# Patient Record
Sex: Male | Born: 1940 | Race: White | Hispanic: No
Health system: Southern US, Community
[De-identification: ages and names within clinical notes are randomized; demographics above are authoritative.]

---

## 2013-12-15 ENCOUNTER — Inpatient Hospital Stay: Payer: Self-pay | Admitting: Family Medicine

## 2013-12-15 LAB — CBC
HCT: 41.3 % (ref 40.0–52.0)
HGB: 13.4 g/dL (ref 13.0–18.0)
MCH: 31 pg (ref 26.0–34.0)
MCHC: 32.3 g/dL (ref 32.0–36.0)
MCV: 96 fL (ref 80–100)
Platelet: 301 10*3/uL (ref 150–440)
RBC: 4.31 10*6/uL — ABNORMAL LOW (ref 4.40–5.90)
RDW: 12.7 % (ref 11.5–14.5)
WBC: 12.5 10*3/uL — ABNORMAL HIGH (ref 3.8–10.6)

## 2013-12-15 LAB — COMPREHENSIVE METABOLIC PANEL
ALBUMIN: 2.2 g/dL — AB (ref 3.4–5.0)
Alkaline Phosphatase: 96 U/L
Anion Gap: 7 (ref 7–16)
BUN: 30 mg/dL — ABNORMAL HIGH (ref 7–18)
Bilirubin,Total: 0.5 mg/dL (ref 0.2–1.0)
CO2: 31 mmol/L (ref 21–32)
Calcium, Total: 8.6 mg/dL (ref 8.5–10.1)
Chloride: 97 mmol/L — ABNORMAL LOW (ref 98–107)
Creatinine: 1.19 mg/dL (ref 0.60–1.30)
EGFR (Non-African Amer.): >60
GLUCOSE: 111 mg/dL — AB (ref 65–99)
Osmolality: 277 (ref 275–301)
Potassium: 3.6 mmol/L (ref 3.5–5.1)
SGOT(AST): 30 U/L (ref 15–37)
SGPT (ALT): 24 U/L (ref 12–78)
Sodium: 135 mmol/L — ABNORMAL LOW (ref 136–145)
Total Protein: 6.8 g/dL (ref 6.4–8.2)

## 2013-12-15 LAB — URINALYSIS, COMPLETE
Bacteria: NONE SEEN
Bilirubin,UR: NEGATIVE
Blood: NEGATIVE
Glucose,UR: NEGATIVE mg/dL (ref 0–75)
KETONE: NEGATIVE
Leukocyte Esterase: NEGATIVE
Nitrite: NEGATIVE
Ph: 5 (ref 4.5–8.0)
Protein: NEGATIVE
RBC,UR: 1 /HPF (ref 0–5)
SQUAMOUS EPITHELIAL: NONE SEEN
Specific Gravity: 1.027 (ref 1.003–1.030)
WBC UR: 3 /HPF (ref 0–5)

## 2013-12-15 LAB — CK: CK, Total: 227 U/L

## 2013-12-15 LAB — TROPONIN I: Troponin-I: <0.02 ng/mL

## 2013-12-16 LAB — CBC WITH DIFFERENTIAL/PLATELET
Basophil #: 0.1 10*3/uL (ref 0.0–0.1)
Basophil %: 0.6 %
Eosinophil #: 0.4 10*3/uL (ref 0.0–0.7)
Eosinophil %: 3.8 %
HCT: 34.2 % — AB (ref 40.0–52.0)
HGB: 11.7 g/dL — AB (ref 13.0–18.0)
Lymphocyte #: 1.8 10*3/uL (ref 1.0–3.6)
Lymphocyte %: 19.1 %
MCH: 32.6 pg (ref 26.0–34.0)
MCHC: 34.2 g/dL (ref 32.0–36.0)
MCV: 95 fL (ref 80–100)
MONO ABS: 0.9 x10 3/mm (ref 0.2–1.0)
Monocyte %: 9.5 %
NEUTROS ABS: 6.4 10*3/uL (ref 1.4–6.5)
Neutrophil %: 67 %
Platelet: 228 10*3/uL (ref 150–440)
RBC: 3.59 10*6/uL — AB (ref 4.40–5.90)
RDW: 12.4 % (ref 11.5–14.5)
WBC: 9.5 10*3/uL (ref 3.8–10.6)

## 2013-12-16 LAB — COMPREHENSIVE METABOLIC PANEL
ALK PHOS: 81 U/L
ALT: 21 U/L (ref 12–78)
ANION GAP: 3 — AB (ref 7–16)
AST: 34 U/L (ref 15–37)
Albumin: 1.7 g/dL — ABNORMAL LOW (ref 3.4–5.0)
BILIRUBIN TOTAL: 0.3 mg/dL (ref 0.2–1.0)
BUN: 24 mg/dL — ABNORMAL HIGH (ref 7–18)
CALCIUM: 7.5 mg/dL — AB (ref 8.5–10.1)
Chloride: 100 mmol/L (ref 98–107)
Co2: 32 mmol/L (ref 21–32)
Creatinine: 1.06 mg/dL (ref 0.60–1.30)
EGFR (African American): >60
EGFR (Non-African Amer.): >60
Glucose: 97 mg/dL (ref 65–99)
OSMOLALITY: 274 (ref 275–301)
Potassium: 3.4 mmol/L — ABNORMAL LOW (ref 3.5–5.1)
Sodium: 135 mmol/L — ABNORMAL LOW (ref 136–145)
Total Protein: 5.7 g/dL — ABNORMAL LOW (ref 6.4–8.2)

## 2013-12-16 LAB — LIPID PANEL
CHOLESTEROL: 91 mg/dL (ref 0–200)
HDL: 30 mg/dL — AB (ref 40–60)
LDL CHOLESTEROL, CALC: 50 mg/dL (ref 0–100)
TRIGLYCERIDES: 57 mg/dL (ref 0–200)
VLDL Cholesterol, Calc: 11 mg/dL (ref 5–40)

## 2013-12-16 LAB — MAGNESIUM: MAGNESIUM: 1.6 mg/dL — AB

## 2013-12-16 LAB — TSH: Thyroid Stimulating Horm: 2.52 u[IU]/mL

## 2013-12-16 LAB — HEMOGLOBIN A1C: Hemoglobin A1C: 5.9 % (ref 4.2–6.3)

## 2013-12-17 LAB — BASIC METABOLIC PANEL
ANION GAP: 4 — AB (ref 7–16)
BUN: 17 mg/dL (ref 7–18)
CHLORIDE: 106 mmol/L (ref 98–107)
Calcium, Total: 7.5 mg/dL — ABNORMAL LOW (ref 8.5–10.1)
Co2: 27 mmol/L (ref 21–32)
Creatinine: 0.94 mg/dL (ref 0.60–1.30)
EGFR (African American): >60
EGFR (Non-African Amer.): >60
GLUCOSE: 95 mg/dL (ref 65–99)
OSMOLALITY: 275 (ref 275–301)
POTASSIUM: 4.3 mmol/L (ref 3.5–5.1)
SODIUM: 137 mmol/L (ref 136–145)

## 2013-12-17 LAB — URINE CULTURE

## 2013-12-19 LAB — CBC WITH DIFFERENTIAL/PLATELET
Basophil #: 0.1 10*3/uL (ref 0.0–0.1)
Basophil %: 0.5 %
EOS PCT: 0.2 %
Eosinophil #: 0 10*3/uL (ref 0.0–0.7)
HCT: 39 % — ABNORMAL LOW (ref 40.0–52.0)
HGB: 13.1 g/dL (ref 13.0–18.0)
LYMPHS ABS: 1.3 10*3/uL (ref 1.0–3.6)
Lymphocyte %: 7.7 %
MCH: 31.8 pg (ref 26.0–34.0)
MCHC: 33.7 g/dL (ref 32.0–36.0)
MCV: 95 fL (ref 80–100)
Monocyte #: 1.1 x10 3/mm — ABNORMAL HIGH (ref 0.2–1.0)
Monocyte %: 6.9 %
Neutrophil #: 13.8 10*3/uL — ABNORMAL HIGH (ref 1.4–6.5)
Neutrophil %: 84.7 %
PLATELETS: 342 10*3/uL (ref 150–440)
RBC: 4.13 10*6/uL — ABNORMAL LOW (ref 4.40–5.90)
RDW: 12.6 % (ref 11.5–14.5)
WBC: 16.3 10*3/uL — ABNORMAL HIGH (ref 3.8–10.6)

## 2013-12-19 LAB — BASIC METABOLIC PANEL
ANION GAP: 7 (ref 7–16)
BUN: 18 mg/dL (ref 7–18)
CALCIUM: 8.6 mg/dL (ref 8.5–10.1)
CO2: 27 mmol/L (ref 21–32)
Chloride: 101 mmol/L (ref 98–107)
Creatinine: 1.69 mg/dL — ABNORMAL HIGH (ref 0.60–1.30)
EGFR (Non-African Amer.): 40 — ABNORMAL LOW
GFR CALC AF AMER: 47 — AB
GLUCOSE: 131 mg/dL — AB (ref 65–99)
Osmolality: 274 (ref 275–301)
Potassium: 4 mmol/L (ref 3.5–5.1)
Sodium: 135 mmol/L — ABNORMAL LOW (ref 136–145)

## 2013-12-20 LAB — CBC WITH DIFFERENTIAL/PLATELET
BASOS ABS: 0.1 10*3/uL (ref 0.0–0.1)
BASOS PCT: 0.5 %
EOS ABS: 0 10*3/uL (ref 0.0–0.7)
Eosinophil %: 0.2 %
HCT: 37.1 % — AB (ref 40.0–52.0)
HGB: 12.4 g/dL — AB (ref 13.0–18.0)
LYMPHS PCT: 8.3 %
Lymphocyte #: 1.8 10*3/uL (ref 1.0–3.6)
MCH: 31.4 pg (ref 26.0–34.0)
MCHC: 33.4 g/dL (ref 32.0–36.0)
MCV: 94 fL (ref 80–100)
MONO ABS: 1.4 x10 3/mm — AB (ref 0.2–1.0)
Monocyte %: 6.6 %
NEUTROS PCT: 84.4 %
Neutrophil #: 18.1 10*3/uL — ABNORMAL HIGH (ref 1.4–6.5)
Platelet: 390 10*3/uL (ref 150–440)
RBC: 3.95 10*6/uL — ABNORMAL LOW (ref 4.40–5.90)
RDW: 12.6 % (ref 11.5–14.5)
WBC: 21.4 10*3/uL — ABNORMAL HIGH (ref 3.8–10.6)

## 2013-12-20 LAB — BASIC METABOLIC PANEL
ANION GAP: 7 (ref 7–16)
BUN: 27 mg/dL — AB (ref 7–18)
CALCIUM: 8.2 mg/dL — AB (ref 8.5–10.1)
CHLORIDE: 99 mmol/L (ref 98–107)
CO2: 25 mmol/L (ref 21–32)
CREATININE: 3.13 mg/dL — AB (ref 0.60–1.30)
EGFR (African American): 22 — ABNORMAL LOW
EGFR (Non-African Amer.): 19 — ABNORMAL LOW
Glucose: 106 mg/dL — ABNORMAL HIGH (ref 65–99)
OSMOLALITY: 268 (ref 275–301)
Potassium: 4.8 mmol/L (ref 3.5–5.1)
Sodium: 131 mmol/L — ABNORMAL LOW (ref 136–145)

## 2013-12-20 LAB — CULTURE, BLOOD (SINGLE)

## 2013-12-20 LAB — CREATININE, SERUM
Creatinine: 3.8 mg/dL — ABNORMAL HIGH (ref 0.60–1.30)
EGFR (African American): 18 — ABNORMAL LOW
EGFR (Non-African Amer.): 15 — ABNORMAL LOW

## 2013-12-21 LAB — URINALYSIS, COMPLETE
Bacteria: NONE SEEN
Bilirubin,UR: NEGATIVE
Blood: NEGATIVE
Glucose,UR: NEGATIVE mg/dL (ref 0–75)
Ketone: NEGATIVE
Leukocyte Esterase: NEGATIVE
Nitrite: NEGATIVE
Ph: 5 (ref 4.5–8.0)
Protein: NEGATIVE
RBC,UR: 5 /HPF (ref 0–5)
SPECIFIC GRAVITY: 1.01 (ref 1.003–1.030)
Squamous Epithelial: NONE SEEN
WBC UR: 1 /HPF (ref 0–5)

## 2013-12-21 LAB — BASIC METABOLIC PANEL
Anion Gap: 7 (ref 7–16)
BUN: 36 mg/dL — AB (ref 7–18)
CALCIUM: 8.1 mg/dL — AB (ref 8.5–10.1)
CREATININE: 4.31 mg/dL — AB (ref 0.60–1.30)
Chloride: 99 mmol/L (ref 98–107)
Co2: 23 mmol/L (ref 21–32)
GFR CALC AF AMER: 15 — AB
GFR CALC NON AF AMER: 13 — AB
GLUCOSE: 108 mg/dL — AB (ref 65–99)
Osmolality: 268 (ref 275–301)
Potassium: 4.9 mmol/L (ref 3.5–5.1)
SODIUM: 129 mmol/L — AB (ref 136–145)

## 2013-12-21 LAB — CBC WITH DIFFERENTIAL/PLATELET
BASOS PCT: 0.3 %
Basophil #: 0.1 10*3/uL (ref 0.0–0.1)
EOS PCT: 0.5 %
Eosinophil #: 0.1 10*3/uL (ref 0.0–0.7)
HCT: 35.4 % — AB (ref 40.0–52.0)
HGB: 11.9 g/dL — AB (ref 13.0–18.0)
LYMPHS PCT: 7 %
Lymphocyte #: 1.5 10*3/uL (ref 1.0–3.6)
MCH: 31.5 pg (ref 26.0–34.0)
MCHC: 33.7 g/dL (ref 32.0–36.0)
MCV: 94 fL (ref 80–100)
MONO ABS: 1.8 x10 3/mm — AB (ref 0.2–1.0)
Monocyte %: 8.6 %
Neutrophil #: 17.5 10*3/uL — ABNORMAL HIGH (ref 1.4–6.5)
Neutrophil %: 83.6 %
Platelet: 400 10*3/uL (ref 150–440)
RBC: 3.78 10*6/uL — ABNORMAL LOW (ref 4.40–5.90)
RDW: 12.5 % (ref 11.5–14.5)
WBC: 20.9 10*3/uL — AB (ref 3.8–10.6)

## 2013-12-22 LAB — CBC WITH DIFFERENTIAL/PLATELET
BASOS ABS: 0.1 10*3/uL (ref 0.0–0.1)
Basophil %: 0.7 %
EOS PCT: 1.7 %
Eosinophil #: 0.2 10*3/uL (ref 0.0–0.7)
HCT: 31.2 % — ABNORMAL LOW (ref 40.0–52.0)
HGB: 10.8 g/dL — ABNORMAL LOW (ref 13.0–18.0)
LYMPHS ABS: 1.5 10*3/uL (ref 1.0–3.6)
LYMPHS PCT: 11.5 %
MCH: 32.5 pg (ref 26.0–34.0)
MCHC: 34.4 g/dL (ref 32.0–36.0)
MCV: 95 fL (ref 80–100)
MONO ABS: 1.2 x10 3/mm — AB (ref 0.2–1.0)
MONOS PCT: 9.4 %
NEUTROS ABS: 9.9 10*3/uL — AB (ref 1.4–6.5)
Neutrophil %: 76.7 %
Platelet: 343 10*3/uL (ref 150–440)
RBC: 3.31 10*6/uL — ABNORMAL LOW (ref 4.40–5.90)
RDW: 12.9 % (ref 11.5–14.5)
WBC: 12.9 10*3/uL — ABNORMAL HIGH (ref 3.8–10.6)

## 2013-12-22 LAB — COMPREHENSIVE METABOLIC PANEL
ALBUMIN: 1.3 g/dL — AB (ref 3.4–5.0)
AST: 30 U/L (ref 15–37)
Alkaline Phosphatase: 74 U/L
Anion Gap: 5 — ABNORMAL LOW (ref 7–16)
BUN: 22 mg/dL — ABNORMAL HIGH (ref 7–18)
Bilirubin,Total: 0.3 mg/dL (ref 0.2–1.0)
Calcium, Total: 8 mg/dL — ABNORMAL LOW (ref 8.5–10.1)
Chloride: 104 mmol/L (ref 98–107)
Co2: 26 mmol/L (ref 21–32)
Creatinine: 1.35 mg/dL — ABNORMAL HIGH (ref 0.60–1.30)
EGFR (African American): >60
EGFR (Non-African Amer.): 53 — ABNORMAL LOW
GLUCOSE: 83 mg/dL (ref 65–99)
OSMOLALITY: 273 (ref 275–301)
Potassium: 3.9 mmol/L (ref 3.5–5.1)
Sodium: 135 mmol/L — ABNORMAL LOW (ref 136–145)
Total Protein: 5.1 g/dL — ABNORMAL LOW (ref 6.4–8.2)

## 2013-12-22 LAB — PROTEIN ELECTROPHORESIS(ARMC)

## 2013-12-23 LAB — CBC WITH DIFFERENTIAL/PLATELET
Basophil #: 0.1 10*3/uL (ref 0.0–0.1)
Basophil %: 1 %
Eosinophil #: 0.2 10*3/uL (ref 0.0–0.7)
Eosinophil %: 2 %
HCT: 30.7 % — ABNORMAL LOW (ref 40.0–52.0)
HGB: 10.1 g/dL — ABNORMAL LOW (ref 13.0–18.0)
LYMPHS ABS: 2 10*3/uL (ref 1.0–3.6)
Lymphocyte %: 20.4 %
MCH: 31.5 pg (ref 26.0–34.0)
MCHC: 33 g/dL (ref 32.0–36.0)
MCV: 95 fL (ref 80–100)
Monocyte #: 0.9 x10 3/mm (ref 0.2–1.0)
Monocyte %: 9.2 %
Neutrophil #: 6.7 10*3/uL — ABNORMAL HIGH (ref 1.4–6.5)
Neutrophil %: 67.4 %
Platelet: 376 10*3/uL (ref 150–440)
RBC: 3.22 10*6/uL — AB (ref 4.40–5.90)
RDW: 12.6 % (ref 11.5–14.5)
WBC: 9.9 10*3/uL (ref 3.8–10.6)

## 2013-12-23 LAB — BASIC METABOLIC PANEL
ANION GAP: 5 — AB (ref 7–16)
BUN: 17 mg/dL (ref 7–18)
Calcium, Total: 7.9 mg/dL — ABNORMAL LOW (ref 8.5–10.1)
Chloride: 102 mmol/L (ref 98–107)
Co2: 28 mmol/L (ref 21–32)
Creatinine: 0.97 mg/dL (ref 0.60–1.30)
EGFR (Non-African Amer.): >60
GLUCOSE: 86 mg/dL (ref 65–99)
Osmolality: 271 (ref 275–301)
POTASSIUM: 3.6 mmol/L (ref 3.5–5.1)
Sodium: 135 mmol/L — ABNORMAL LOW (ref 136–145)

## 2013-12-23 LAB — UR PROT ELECTROPHORESIS, URINE RANDOM

## 2014-01-02 ENCOUNTER — Encounter: Payer: Self-pay | Admitting: Surgery

## 2014-01-07 ENCOUNTER — Other Ambulatory Visit: Payer: Self-pay | Admitting: Surgery

## 2014-01-07 LAB — URINALYSIS, COMPLETE
Bilirubin,UR: NEGATIVE
Glucose,UR: NEGATIVE mg/dL (ref 0–75)
Hyaline Cast: 47
Ketone: NEGATIVE
Nitrite: POSITIVE
Ph: 6 (ref 4.5–8.0)
Protein: 100
RBC,UR: 277 /HPF (ref 0–5)
SPECIFIC GRAVITY: 1.014 (ref 1.003–1.030)
Squamous Epithelial: NONE SEEN

## 2014-01-14 ENCOUNTER — Encounter: Payer: Self-pay | Admitting: Surgery

## 2014-01-16 ENCOUNTER — Encounter: Payer: Self-pay | Admitting: Surgery

## 2014-01-26 LAB — BASIC METABOLIC PANEL
ANION GAP: 8 (ref 7–16)
BUN: 12 mg/dL (ref 7–18)
CO2: 25 mmol/L (ref 21–32)
Calcium, Total: 8.4 mg/dL — ABNORMAL LOW (ref 8.5–10.1)
Chloride: 103 mmol/L (ref 98–107)
Creatinine: 0.98 mg/dL (ref 0.60–1.30)
EGFR (African American): >60
EGFR (Non-African Amer.): >60
GLUCOSE: 209 mg/dL — AB (ref 65–99)
OSMOLALITY: 278 (ref 275–301)
Potassium: 3.1 mmol/L — ABNORMAL LOW (ref 3.5–5.1)
SODIUM: 136 mmol/L (ref 136–145)

## 2014-01-26 LAB — URINALYSIS, COMPLETE
BILIRUBIN, UR: NEGATIVE
Glucose,UR: NEGATIVE mg/dL (ref 0–75)
Ketone: NEGATIVE
Nitrite: POSITIVE
Ph: 5 (ref 4.5–8.0)
Protein: 30
Specific Gravity: 1.025 (ref 1.003–1.030)
WBC UR: 129 /HPF (ref 0–5)

## 2014-01-26 LAB — CBC WITH DIFFERENTIAL/PLATELET
BASOS ABS: 0.1 10*3/uL (ref 0.0–0.1)
Basophil %: 0.7 %
Eosinophil #: 0.1 10*3/uL (ref 0.0–0.7)
Eosinophil %: 0.4 %
HCT: 37.8 % — ABNORMAL LOW (ref 40.0–52.0)
HGB: 12.4 g/dL — ABNORMAL LOW (ref 13.0–18.0)
LYMPHS ABS: 2.6 10*3/uL (ref 1.0–3.6)
LYMPHS PCT: 18 %
MCH: 29.4 pg (ref 26.0–34.0)
MCHC: 32.9 g/dL (ref 32.0–36.0)
MCV: 89 fL (ref 80–100)
Monocyte #: 1.5 x10 3/mm — ABNORMAL HIGH (ref 0.2–1.0)
Monocyte %: 10.5 %
Neutrophil #: 10.1 10*3/uL — ABNORMAL HIGH (ref 1.4–6.5)
Neutrophil %: 70.4 %
Platelet: 401 10*3/uL (ref 150–440)
RBC: 4.23 10*6/uL — AB (ref 4.40–5.90)
RDW: 14.1 % (ref 11.5–14.5)
WBC: 14.3 10*3/uL — ABNORMAL HIGH (ref 3.8–10.6)

## 2014-01-26 LAB — TROPONIN I

## 2014-01-27 ENCOUNTER — Inpatient Hospital Stay: Payer: Self-pay | Admitting: Internal Medicine

## 2014-01-27 LAB — SYNOVIAL CELL COUNT + DIFF, W/ CRYSTALS
BASOS ABS: 0 %
Crystals, Joint Fluid: NONE SEEN
Eosinophil: 0 %
LYMPHS PCT: 0 %
Neutrophils: 92 %
Nucleated Cell Count: 37272 /mm3
OTHER MONONUCLEAR CELLS: 8 %
Other Cells BF: 0 %

## 2014-01-27 LAB — HEMOGLOBIN A1C: HEMOGLOBIN A1C: 5.9 % (ref 4.2–6.3)

## 2014-01-28 LAB — BASIC METABOLIC PANEL
Anion Gap: 3 — ABNORMAL LOW (ref 7–16)
BUN: 11 mg/dL (ref 7–18)
CALCIUM: 8 mg/dL — AB (ref 8.5–10.1)
CHLORIDE: 103 mmol/L (ref 98–107)
CREATININE: 0.86 mg/dL (ref 0.60–1.30)
Co2: 29 mmol/L (ref 21–32)
Glucose: 154 mg/dL — ABNORMAL HIGH (ref 65–99)
Osmolality: 273 (ref 275–301)
Potassium: 3.2 mmol/L — ABNORMAL LOW (ref 3.5–5.1)
Sodium: 135 mmol/L — ABNORMAL LOW (ref 136–145)

## 2014-01-28 LAB — CBC WITH DIFFERENTIAL/PLATELET
BASOS PCT: 0.5 %
Basophil #: 0.1 10*3/uL (ref 0.0–0.1)
Eosinophil #: 0.3 10*3/uL (ref 0.0–0.7)
Eosinophil %: 1.8 %
HCT: 33.5 % — AB (ref 40.0–52.0)
HGB: 10.9 g/dL — ABNORMAL LOW (ref 13.0–18.0)
LYMPHS ABS: 1.9 10*3/uL (ref 1.0–3.6)
LYMPHS PCT: 13 %
MCH: 29 pg (ref 26.0–34.0)
MCHC: 32.6 g/dL (ref 32.0–36.0)
MCV: 89 fL (ref 80–100)
MONO ABS: 1.3 x10 3/mm — AB (ref 0.2–1.0)
MONOS PCT: 8.6 %
NEUTROS ABS: 11.2 10*3/uL — AB (ref 1.4–6.5)
Neutrophil %: 76.1 %
PLATELETS: 298 10*3/uL (ref 150–440)
RBC: 3.76 10*6/uL — AB (ref 4.40–5.90)
RDW: 14 % (ref 11.5–14.5)
WBC: 14.7 10*3/uL — ABNORMAL HIGH (ref 3.8–10.6)

## 2014-01-28 LAB — CREATININE, SERUM
CREATININE: 0.89 mg/dL (ref 0.60–1.30)
EGFR (African American): >60
EGFR (Non-African Amer.): >60

## 2014-01-28 LAB — VANCOMYCIN, TROUGH: Vancomycin, Trough: 17 ug/mL (ref 10–20)

## 2014-01-29 LAB — BASIC METABOLIC PANEL
Anion Gap: 5 — ABNORMAL LOW (ref 7–16)
BUN: 17 mg/dL (ref 7–18)
Calcium, Total: 8.3 mg/dL — ABNORMAL LOW (ref 8.5–10.1)
Chloride: 105 mmol/L (ref 98–107)
Co2: 25 mmol/L (ref 21–32)
Creatinine: 0.83 mg/dL (ref 0.60–1.30)
EGFR (Non-African Amer.): >60
Glucose: 159 mg/dL — ABNORMAL HIGH (ref 65–99)
OSMOLALITY: 275 (ref 275–301)
POTASSIUM: 4 mmol/L (ref 3.5–5.1)
Sodium: 135 mmol/L — ABNORMAL LOW (ref 136–145)

## 2014-01-29 LAB — CBC WITH DIFFERENTIAL/PLATELET
BASOS ABS: 0.1 10*3/uL (ref 0.0–0.1)
Basophil %: 0.3 %
EOS PCT: 0 %
Eosinophil #: 0 10*3/uL (ref 0.0–0.7)
HCT: 33.1 % — AB (ref 40.0–52.0)
HGB: 11 g/dL — ABNORMAL LOW (ref 13.0–18.0)
LYMPHS ABS: 1.5 10*3/uL (ref 1.0–3.6)
Lymphocyte %: 8.5 %
MCH: 29.3 pg (ref 26.0–34.0)
MCHC: 33.1 g/dL (ref 32.0–36.0)
MCV: 88 fL (ref 80–100)
MONOS PCT: 4.3 %
Monocyte #: 0.7 x10 3/mm (ref 0.2–1.0)
NEUTROS ABS: 15.1 10*3/uL — AB (ref 1.4–6.5)
Neutrophil %: 86.9 %
PLATELETS: 329 10*3/uL (ref 150–440)
RBC: 3.74 10*6/uL — AB (ref 4.40–5.90)
RDW: 14.1 % (ref 11.5–14.5)
WBC: 17.4 10*3/uL — ABNORMAL HIGH (ref 3.8–10.6)

## 2014-01-30 LAB — CBC WITH DIFFERENTIAL/PLATELET
Basophil #: 0 10*3/uL (ref 0.0–0.1)
Basophil %: 0.1 %
EOS ABS: 0 10*3/uL (ref 0.0–0.7)
EOS PCT: 0 %
HCT: 32.1 % — ABNORMAL LOW (ref 40.0–52.0)
HGB: 10.7 g/dL — AB (ref 13.0–18.0)
LYMPHS ABS: 1.4 10*3/uL (ref 1.0–3.6)
LYMPHS PCT: 6.2 %
MCH: 29.4 pg (ref 26.0–34.0)
MCHC: 33.2 g/dL (ref 32.0–36.0)
MCV: 89 fL (ref 80–100)
MONO ABS: 0.8 x10 3/mm (ref 0.2–1.0)
Monocyte %: 3.8 %
NEUTROS PCT: 89.9 %
Neutrophil #: 19.9 10*3/uL — ABNORMAL HIGH (ref 1.4–6.5)
Platelet: 314 10*3/uL (ref 150–440)
RBC: 3.63 10*6/uL — ABNORMAL LOW (ref 4.40–5.90)
RDW: 13.6 % (ref 11.5–14.5)
WBC: 22.1 10*3/uL — AB (ref 3.8–10.6)

## 2014-01-31 LAB — CBC WITH DIFFERENTIAL/PLATELET
BASOS PCT: 0.2 %
Basophil #: 0 10*3/uL (ref 0.0–0.1)
EOS ABS: 0 10*3/uL (ref 0.0–0.7)
EOS PCT: 0.1 %
HCT: 35.9 % — ABNORMAL LOW (ref 40.0–52.0)
HGB: 11.7 g/dL — ABNORMAL LOW (ref 13.0–18.0)
Lymphocyte #: 2.5 10*3/uL (ref 1.0–3.6)
Lymphocyte %: 15.8 %
MCH: 28.8 pg (ref 26.0–34.0)
MCHC: 32.6 g/dL (ref 32.0–36.0)
MCV: 88 fL (ref 80–100)
MONO ABS: 1.2 x10 3/mm — AB (ref 0.2–1.0)
Monocyte %: 7.6 %
NEUTROS PCT: 76.3 %
Neutrophil #: 12 10*3/uL — ABNORMAL HIGH (ref 1.4–6.5)
Platelet: 378 10*3/uL (ref 150–440)
RBC: 4.07 10*6/uL — ABNORMAL LOW (ref 4.40–5.90)
RDW: 13.6 % (ref 11.5–14.5)
WBC: 15.7 10*3/uL — AB (ref 3.8–10.6)

## 2014-01-31 LAB — URINE CULTURE

## 2014-02-01 LAB — BODY FLUID CULTURE

## 2014-02-13 ENCOUNTER — Encounter: Payer: Self-pay | Admitting: Surgery

## 2015-02-06 NOTE — Op Note (Signed)
PATIENT NAME:  Jonathan Hogan, Desmund S MR#:  [id number] DATE OF BIRTH:  1941-04-04  DATE OF PROCEDURE:  12/20/2013  PREOPERATIVE DIAGNOSIS:  Left hip decubitus ulcer.   POSTOPERATIVE DIAGNOSIS:  Left hip decubitus ulcer.  OPERATION:  Wound vac change.   SURGEON:  Quentin Orealph L. Ely, M.D.   ANESTHESIA:  Monitored anesthetic care.   With the patient in the supine position, the patient was sedated and then moved into the left side up position, secured by the bean bag device.  The previous wound VAC was removed without difficulty.  There was significant improvement in the wound from the previous evaluation.  The area was cleaned aggressively.  Meptel was placed on both sides of the wound because of the skin breakdown.  Ioban placed over the base of the wound.  The wound was then uncovered.  Black foam inserted into the base of the wound covered with a second layer of dressing.  The wound VAC was then placed in a standard fashion.  Suction was achieved without difficulty.  The patient appeared to tolerate the procedure well.  He was placed back on the stretcher and returned to the recovery room.    ____________________________ Quentin Orealph L. Ely III, MD rle:ea D: 12/20/2013 08:53:45 ET T: 12/20/2013 17:34:27 ET JOB#: [id number]  cc: Quentin Orealph L. Ely III, MD, <Dictator> Quentin OreALPH L ELY MD ELECTRONICALLY SIGNED 12/21/2013 18:28

## 2015-02-06 NOTE — H&P (Signed)
PATIENT NAME:  Jonathan Hogan, Jonathan Hogan MR#:  [id number] DATE OF BIRTH:  January 06, 1941  DATE OF ADMISSION:  01/27/2014  REFERRING PHYSICIAN: Dr. Governor Rooksebecca Lord  PRIMARY CARE PHYSICIAN: Nonlocal.   CHIEF COMPLAINT: Elbow pain and cough.  HISTORY OF PRESENT ILLNESS: A [age] year old Caucasian gentleman with a past medical history of sacral decubitus ulcer status post wound VAC treatments as well as significant tobacco abuse, presenting with right elbow pain and cough. Describes 2-day duration of right elbow pain described only as "pain". Intensity 10/10, worse with movement. No relieving factors, associated redness and swelling of the elbow. Denies any local trauma. He also complains of a cough for 2 to 3-day duration productive of whitish to clear sputum with associated shortness of breath, described as dyspnea on exertion. Denies any chest pain or recent sick contacts. Also denotes having dysuria, which is intermittent.   REVIEW OF SYSTEMS: CONSTITUTIONAL: In the emergency department noted to be hypoxemic, tachypneic, tachycardic in the emergency room. Denies fevers, chills, weakness.  EYES: Denies blurry, double vision, or eye pain.  HEENT: Denies tinnitus, ear pain, hearing loss.  RESPIRATORY: Positive for cough and shortness of breath as described above.  CARDIOVASCULAR: Denies chest pain, palpitations, edema.  GASTROINTESTINAL: Denies nausea, vomiting, diarrhea, abdominal pain.  GENITOURINARY: Positive for dysuria. Denies hematuria.  ENDOCRINE: Denies nocturia or thyroid problems.  HEMATOLOGIC AND LYMPHATIC: Denies easy bruising or bleeding. SKIN: Positive for erythema over the right elbow. Denies any rashes or lesions.  MUSCULOSKELETAL: Positive for pain in the right elbow as described above. Denies pain in neck, back, shoulders, knees, or hips. No arthritic symptoms.  NEUROLOGIC: Denies paralysis or paresthesias.  PSYCHIATRIC: Denies anxiety or depressive symptoms.   Otherwise, full review of systems  performed by me is negative.    PAST MEDICAL HISTORY: Significant tobacco abuse, osteoarthritis, as well as sacral decubitus ulcer status post wound VAC treatment.   SOCIAL HISTORY: Positive for tobacco use 1-1/2 packs daily, as well as alcohol usage. States that he drinks "Whenever I can get it." Denies drug use.   FAMILY HISTORY: Denies any cardiovascular or pulmonary disorders.   ALLERGIES: No known allergies.   HOME MEDICATIONS: Acetaminophen 325 mg 2 tabs every 4 hours as needed for pain or fever. Otherwise, no further medications.   PHYSICAL EXAMINATION:  VITAL SIGNS: Temperature 98.3, heart rate 112, respirations 24, blood pressure 111/70, saturating 93% on supplemental O2. Weight 83.9 kg, BMI 26.5.  GENERAL: A somewhat disheveled Caucasian gentleman, currently in no acute distress.  HEAD: Normocephalic, atraumatic.  EYES: Pupils equal, round, reactive to light. Extraocular muscles intact. No scleral icterus.  MOUTH: Moist mucous membranes.  Dentition intact. No abscess noted.  EARS, NOSE, AND THROAT: Clear without exudates. No external lesions.  NECK: Supple. No thyromegaly. No nodules. No JVD.  PULMONARY: Coarse breath sounds, diminished breath sounds throughout all lung fields with scan wheeze noted. No use of accessory muscles but tachypneic. Good respiratory effort.  CHEST:  Nontender on palpation.  CARDIOVASCULAR: S1, S2, tachycardic. No murmurs, rubs, or gallops. No edema. Pedal pulses 2+ bilaterally.  GASTROINTESTINAL: Soft, nontender, nondistended. No masses. Positive bowel sounds. No hepatosplenomegaly.  MUSCULOSKELETAL: Over the right elbow is currently bent, after joint aspiration, there is some surrounding erythema and edema. The joint is also tender to palpitation. Otherwise no further joint swelling, clubbing, or edema. Range of motion is full in all extremities. NEUROLOGIC: Cranial nerves II through XII intact. No gross neurologic deficits. Sensation intact. Reflexes  intact.  SKIN: No ulcerations, lesions, no  rashes, or cyanosis. Skin warm and dry and turgor intact. PSYCHIATRIC: Mood and affect within normal limits. The patient alert and oriented x 3. Insight and judgment intact.   LABORATORY DATA: Chest x-ray performed revealing no acute cardiopulmonary process. Sodium 136, potassium 3.1, chloride 103, bicarbonate 25, BUN 12, creatinine 0.98, glucose 209. Troponin less than 0.02. WBC 14.3, hemoglobin 12.4, platelets of 401,000. Synovial fluid: Most of the results are pending at this time. However, nucleated cells 37,272, which is 92% neutrophils. No crystals .   URINALYSIS: WBCs 129, RBCs 15, leukocyte esterase 3+, nitrite positive. Epithelial cells 1.   ASSESSMENT AND PLAN: A 74 year old gentleman presenting with right elbow pain cough.  1. Sepsis: Meeting septic criteria by respiratory rate, heart rate, and leukocytosis. Multiple possible etiology of source including urinary tract infection and septic joint. Panculture including urine, blood, and joint aspirate for Gram stain. Antibiotic coverage will dose ceftriaxone 2 grams as well as the addition of vancomycin, received azithromycin in the emergency department so far. Will provide IV fluid hydration to keep mean arterial pressure greater than 65. Follow culture data. Whenever it returns, we can downgrade antibiotic therapy.  2. Hyperglycemia with random blood glucose greater than 200. Likely, diabetic we will check hemoglobin A1c. Add insulin sliding scale with q. 6 hour Accu-Cheks.  3. Hypokalemia. Replace potassium to goal of 4-5.  4. Acute exacerbation given by cough and hypoxemia and wheezing on exam. DuoNeb therapy as well as supplemental oxygen as required to keep oxygen saturation greater than 92%. 5. Venous thromboembolism prophylaxis with sequential compression devices.   CODE STATUS: The patient is full code.   TIME SPENT: 45 minutes.   ____________________________ Cletis Athens. Aeden Matranga,  MD dkh:lt D: 01/27/2014 00:43:12 ET T: 01/27/2014 01:17:37 ET JOB#: [id number]  cc: Cletis Athens. Aslyn Cottman, MD, <Dictator> Treyten Monestime Synetta Shadow MD ELECTRONICALLY SIGNED 01/27/2014 2:50

## 2015-02-06 NOTE — Discharge Summary (Signed)
PATIENT NAME:  Jonathan Hogan, Jonathan Hogan MR#:  [id number] DATE OF BIRTH:  02-Oct-1941  DATE OF ADMISSION:  01/27/2014  DATE OF DISCHARGE:  01/31/2014  DISCHARGE DIAGNOSES:   1.  Sepsis secondary to right elbow cellulitis and urinary tract infection that was treated. 2.  Chronic obstructive pulmonary disease exacerbation.  3.  Hyperglycemia due to steroids.  4.  Tobacco abuse.  5.  Hypokalemia.   DISCHARGE MEDICATIONS:   1.  Erythromycin 500 mg p.o. daily for 3 days.  2.  Keflex 500 mg p.o. 4 times daily for 10 days.  3.  Prednisone 20 mg 3 tablets daily for 3 days, 2 tablets daily for 3 days, 1 tablet daily for 3 days.  4.  Spiriva 18 mcg inhalation daily.  5.  Albuterol 2 puffs 4 times daily.  Note that Albuterol, azithromycin, Keflex, Advair, prednisone, and Spiriva all are new medications.   CONSULTATIONS: Orthopedic consult with Dr. Martha Clan.  IMAGING: The patient's troponin is less than 0.02 on admission. Hemoglobin A1c is 5.9. Electrolytes on admission: Sodium 136, potassium 3.1, chloride 103, bicarb 25, BUN 12, creatinine 0.96, glucose 209. White count on admission 14.3, hemoglobin 12.4, hematocrit 37.8, platelets 401. Head CT on admission shows no acute intracranial abnormalities. Findings suggestive of acute sinusitis and mild cerebral atrophy. EKG shows sinus tachycardia with some PVCs. Chest x-ray showed no active pulmonary disease. UA showed WBC 129 and leukocyte esterase 3+. Blood cultures were negative.   The patient had arthrocentesis from the right elbow, and cultures from that showed negative. X-ray of the right elbow showed small joint effusion. MRA of the right hand showed diffuse subcutaneous soft tissue swelling and edema without  focal abscess. The patient's right wrist and elbow joint showed effusion, but no specific findings to suggest septic arthritis or osteomyelitis. White count increased to 22.1 on April 17. The patient's urine culture showed 1000 colonies for gram-negative  rods. White count did come down to 15.7 on April 18.   VITAL SIGNS: At time of discharge showed temperature 97.4, heart rate 76, blood pressure 150/92, sats 94% on room air.   HOSPITAL COURSE: This patient is a 74 year old male patient with a history of heavy tobacco abuse, who came in because of elbow pain and cough. The patient has right elbow pain associated with swelling for 2 days. The patient did not have any insect bite. Look at the History and Physical for full details.   1. The patient was admitted for sepsis because of his vitals with heart tachycardia and leukocytosis. The patient's sepsis was thought to be due to UTI and possible septic joint. The patient had arthrocentesis in the ER, and cultures showed negative. He was started on Rocephin and vancomycin and azithromycin. The patient was seen by Dr. Martha Clan as well regarding his elbow cellulitis, and he had an MRI of the right elbow and x-ray, which did not show any septic joint. The patient's range of motion was good at the fingers, and we continued the vancomycin and ceftriaxone. The right thumb was elevated with ice packs to decrease the swelling. The patient's cellulitis nicely improved without any surgical intervention, and patient was thought to have only skin and soft tissue infection rather than a joint infection. At the time of discharge, he was able to move the right hand and the swelling is nearly gone.  The patient's range of motion is intact. We discharged him with Keflex, and he can follow with Dr. Martha Clan in 2 to 3 weeks, if the  symptoms worsen or recur.   2.  Chronic obstructive pulmonary disease exacerbation. The patient smokes 2 packs per day. He moved from [place]. The patient has no primary doctor here. He did have lots of cough and trouble breathing, but was not hypoxic. We continued him on antibiotics and steroids, discharged him home with steroids and antibiotics. I have added the Advair and Spiriva Combivent. The  patient is advised to quit smoking, and he is not interested in that.   3.  Urinary tract infection. The patient's urine culture showed less than 100,000 colonies of gram-negative rods, and hypokalemia treated.   4.  Hyperglycemia. The patient's hemoglobin A1c is only 5.9.   5. Elevated blood pressure, without diagnosis of hypertension. The patient was a little anxious yesterday, wanted to go home. His blood pressure was slightly elevated, but during the hospital stay his blood pressure was around 120/80. I did not start him on any medication for the blood pressure.   The patient is discharged home with Home Health.   Time spent is more than 30 minutes.    ____________________________ Katha HammingSnehalatha Athea Haley, MD sk:mr D: 02/01/2014 07:56:37 ET T: 02/01/2014 16:33:23 ET JOB#: [id number]  cc: Katha HammingSnehalatha Dilan Fullenwider, MD, <Dictator> Katha HammingSNEHALATHA Terilynn Buresh MD ELECTRONICALLY SIGNED 02/15/2014 22:38

## 2015-02-06 NOTE — Op Note (Signed)
PATIENT NAME:  Jonathan Hogan, Jonathan Hogan MR#:  [id number] DATE OF BIRTH:  09-Apr-1941  DATE OF PROCEDURE:  12/17/2013   PREOPERATIVE DIAGNOSIS: Left hip decubitus.  POSTOPERATIVE DIAGNOSIS:  Left hip decubitus.  OPERATION:  Debridement and wound VAC placement.  SURGEON:  Dr. Michela PitcherEly.  ASSISTANVeleta Miners:  Ollis, PA student.  ANESTHESIA:  General.  OPERATIVE PROCEDURE:  With the patient in supine position after induction of appropriate general anesthesia, he was then placed in the right side down position with the beanbag patient positioner. Arm was placed over his head. Left hip was prepped with Betadine and draped with sterile towels. The eschar was ellipsed and removed. Combination of sharp dissection with a knife and Bovie dissection removed the large eschar, including skin and subcutaneous tissue. There was no exposed bone and no exposed muscle.  The area was cauterized, irrigated and a wound VAC placed. The skin was protected with  Mepitel dressing. The patient returned to the recovery room in satisfactory condition.    ____________________________ Quentin Orealph L. Ely III, MD rle:ce D: 12/17/2013 11:34:03 ET T: 12/17/2013 18:38:09 ET JOB#: [id number]  cc: Quentin Orealph L. Ely III, MD, <Dictator> Quentin OreALPH L ELY MD ELECTRONICALLY SIGNED 12/19/2013 12:30

## 2015-02-06 NOTE — Discharge Summary (Signed)
PATIENT NAME:  Jonathan Hogan, [name]:  [id number] DATE OF BIRTH:  [birth year]  DATE OF ADMISSION:  12/15/2013 DATE OF DISCHARGE:  12/26/2013  PRESENTING COMPLAINT: Cellulitis and decubitus ulcer.   CONSULTANTS: Surgery, Dr. Michela PitcherEly.  PROCEDURES: Left hip decubitus debridement and wound VAC placement.   CONDITION ON DISCHARGE: Fair.  HOME CARE: Home health PT and RN has been set up.   FINAL DISCHARGE DIAGNOSES:  1.  Left decubitus ulcer of the buttock status post debridement with wound VAC placement.  2.  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  CODE STATUS: FULL.  DISCHARGE MEDICATIONS: 1.  Tylenol 325 mg 2 tablets every 4 hours as needed.  2.  Docusate 100 mg 1 capsule b.i.d. as needed.  3.  Tamsulosin 0.4 mg p.o. daily.  4.  Magnesium oxide 400 mg p.o. daily.   DISCHARGE INSTRUCTIONS:  1.  Home health nurse for wound VAC management.  2.  Follow up with Dr. Michela PitcherEly and Dr. Anda KraftMarterre in 1 to 2 weeks.  3.  Follow up with Dr. Achilles Dunkope, urology, on March 19th at 9:00 a.m. for urinary retention.  4.  Foley care.   LABORATORY DATA: H and H is 10.1 and 30.7, white count is 9.9. Basic metabolic panel within normal limits.   HISTORY AND HOSPITAL COURSE:  1.  [name] is a 74 year old Caucasian gentleman with history of COPD and alcohol abuse who is brought in from [place], [place] with large ulcer on the left buttock. He was admitted with left gluteal cellulitis with decubitus ulcer. He underwent debridement on the day of admission along with replacement of wound VAC by Dr. Michela PitcherEly, on 12/20/2013. He had sepsis on admission that has resolved. Physical therapy recommended home health PT and RN for wound VAC management.  2.  Generalized weakness. Continued physical therapy.  3.  Acute renal failure, likely secondary to ATN, resolved after IV fluids. Nephrology consultation was obtained. The patient's renal ultrasound was negative. His creatinine at  discharge was 0.9.  4.  UTI. Finished up a course of antibiotic which was used both for his decubitus ulcer and UTI.  5.  COPD, stable.  6.  Alcohol abuse. The patient was kept on CIWA protocol, remained stable.  7.  Urinary retention. The patient developed urinary retention after his Foley catheter was removed. He does have history of suspected BPH. Foley had to be replaced and spoke with Dr. Wynn Maudlinope's office and made an appointment for him to follow-up outpatient for further evaluation on his prostate and removal of Foley as outpatient.   Hospital stay otherwise remained stable. The patient remained a FULL code.   TIME SPENT: 40 minutes.  ____________________________ Wylie HailSona A. Allena KatzPatel, MD sap:sb D: 12/30/2013 13:15:26 ET T: 12/30/2013 14:37:13 ET JOB#: [id number]  cc: Jonee Lamore A. Allena KatzPatel, MD, <Dictator> Carmie Endalph L. Ely III, MD Madolyn FriezeBrian S. Achilles Dunkope, MD  Willow OraSONA A Castella Lerner MD ELECTRONICALLY SIGNED 01/02/2014 10:43

## 2015-02-06 NOTE — Consult Note (Signed)
Brief Consult Note: Diagnosis: Right elbow, forearm and wrist pain and swelling.   Patient was seen by consultant.   Consult note dictated.   Recommend further assessment or treatment.   Orders entered.   Comments: 74 y/o male who is admitted with criteria for sepsis.  Ortho consulted to evaluate right elbow pain.  Aspiration of the right elbow already performed.   Not clear if olecranon bursa aspirated or if actual elbow joint was aspirated.  Gram stain does not show organisms.  Cultures negative so far.  WBC elevated.  Patient with elbow, forearm and wrist tenderness.   Forearm and hand with edema.   No break in integument seen.  I have ordered plain xrays of the right elbow and a forearm MRI.  Right arm should be elevated on pillows and ice bags applied to right hand and forearm.  Patient already on vancomycin and cetriaxone.  Most likely cellulits, but MRI will help evaluate for abscess.  Electronic Signatures: Jonathan Hogan, Jonathan Hogan (MD)  (Signed 14-Apr-15 21:11)  Authored: Brief Consult Note   Last Updated: 14-Apr-15 21:11 by Jonathan Hogan, Verlinda Slotnick (MD)
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
## 2015-02-06 NOTE — Consult Note (Signed)
PATIENT NAME:  Jonathan Hogan, Jonathan Hogan MR#:  [id number] DATE OF BIRTH:  [birth year]  DATE OF CONSULTATION:  01/27/2014  CONSULTING PHYSICIAN:  Kathreen DevoidKevin L. Jahanna Raether, MD   REASON FOR CONSULTATION: Right elbow pain and swelling.   HISTORY OF PRESENT ILLNESS: Mr. Jonathan Hogan is a 74 year old male who was admitted to the medical service from the Emergency Department. He presented there with complaints of right elbow pain and a cough. The patient states that the pain in the right elbow began spontaneously. He awoke with the pain. He states that his right elbow was aspirated in the Emergency Department.   PHYSICAL EXAMINATION:  EXTREMITIES:  The patient had his right elbow Ace wrapped. This was removed for examination. The patient did not have significant swelling over the right elbow. Posteriorly, he had faint erythema and had mild tenderness over the right elbow on exam today. He had pain with motion of the right elbow, wrist and digits. His forearm showed signs of edema, particularly distal to where the end of the Ace wrap was on the proximal forearm. The patient also had edema in the hand and digits. His fingers were well-perfused and he had a palpable radial pulse. The patient was not able to lift the arm on his own because of pain. He had no tenderness or swelling of the upper arm or shoulder.   He can actively flex and extend his digits, but range of motion was limited secondary to pain and swelling. Forearm rotation was also painful for the patient. His left upper extremity showed no evidence of edema. The patient had a slight erythematous rash on the palmar side of both hands.  His forearm compartment on the right side was soft and compressible.   ASSESSMENT:  A patient with right elbow pain, as well as edema in the right forearm and hand, likely cellulitis.   PLAN: It is unclear the etiology of the edema of the right upper extremity; most likely cellulitis, although no penetrating injury was seen. The patient has  been previously aspirated in the ED, and his Gram stain was negative for organisms and the cultures are negative to date. He had a white count upon admission of 14.3. There are 70% neutrophils.   The patient'Hogan last vitals showed a temperature of 99.7. He has not had a temperature above 100 since being admitted to the floor.    I am ordering plain films of the right elbow, looking for a possible foreign body versus arthritis versus fracture. I am also going to order an MRI of the forearm given the edema there; looking for any area of fluctuance to suggest abscess. The patient is currently already on vancomycin and ceftriaxone, and I would continue this antibiotic  until the final cultures are available. I wrapped the entire right hand, forearm and elbow again today after my exam. I have ordered that the patient have his right arm elevated on pillows and ice packs applied to help reduce the edema and swelling. I will follow up with the radiographic studies tomorrow and will reassess the patient in the morning. I believe his edema is from cellulitis, and I do not see any need for surgical intervention at this time.    ____________________________ Kathreen DevoidKevin L. Zira Helinski, MD klk:dmm D: 01/27/2014 21:23:44 ET T: 01/27/2014 22:30:19 ET JOB#: [id number]  cc: Kathreen DevoidKevin L. Cheyne Bungert, MD, <Dictator> Kathreen DevoidKEVIN L Anslee Micheletti MD ELECTRONICALLY SIGNED 02/03/2014 15:31

## 2015-02-06 NOTE — H&P (Signed)
PATIENT NAME:  Jonathan Hogan, Jonathan Hogan MR#:  [id number] DATE OF BIRTH:  05/19/1941  DATE OF ADMISSION:  01/27/2014  REFERRING PHYSICIAN: Dr. Rebecca Lord  PRIMARY CARE PHYSICIAN: Nonlocal.   CHIEF COMPLAINT: Elbow pain and cough.  HISTORY OF PRESENT ILLNESS: A 74-year-old Caucasian gentleman with a past medical history of sacral decubitus ulcer status post wound VAC treatments as well as significant tobacco abuse, presenting with right elbow pain and cough. Describes 2-day duration of right elbow pain described only as "pain". Intensity 10/10, worse with movement. No relieving factors, associated redness and swelling of the elbow. Denies any local trauma. He also complains of a cough for 2 to 3-day duration productive of whitish to clear sputum with associated shortness of breath, described as dyspnea on exertion. Denies any chest pain or recent sick contacts. Also denotes having dysuria, which is intermittent.   REVIEW OF SYSTEMS: CONSTITUTIONAL: In the emergency department noted to be hypoxemic, tachypneic, tachycardic in the emergency room. Denies fevers, chills, weakness.  EYES: Denies blurry, double vision, or eye pain.  HEENT: Denies tinnitus, ear pain, hearing loss.  RESPIRATORY: Positive for cough and shortness of breath as described above.  CARDIOVASCULAR: Denies chest pain, palpitations, edema.  GASTROINTESTINAL: Denies nausea, vomiting, diarrhea, abdominal pain.  GENITOURINARY: Positive for dysuria. Denies hematuria.  ENDOCRINE: Denies nocturia or thyroid problems.  HEMATOLOGIC AND LYMPHATIC: Denies easy bruising or bleeding. SKIN: Positive for erythema over the right elbow. Denies any rashes or lesions.  MUSCULOSKELETAL: Positive for pain in the right elbow as described above. Denies pain in neck, back, shoulders, knees, or hips. No arthritic symptoms.  NEUROLOGIC: Denies paralysis or paresthesias.  PSYCHIATRIC: Denies anxiety or depressive symptoms.   Otherwise, full review of systems  performed by me is negative.    PAST MEDICAL HISTORY: Significant tobacco abuse, osteoarthritis, as well as sacral decubitus ulcer status post wound VAC treatment.   SOCIAL HISTORY: Positive for tobacco use 1-1/2 packs daily, as well as alcohol usage. States that he drinks "Whenever I can get it." Denies drug use.   FAMILY HISTORY: Denies any cardiovascular or pulmonary disorders.   ALLERGIES: No known allergies.   HOME MEDICATIONS: Acetaminophen 325 mg 2 tabs every 4 hours as needed for pain or fever. Otherwise, no further medications.   PHYSICAL EXAMINATION:  VITAL SIGNS: Temperature 98.3, heart rate 112, respirations 24, blood pressure 111/70, saturating 93% on supplemental O2. Weight 83.9 kg, BMI 26.5.  GENERAL: A somewhat disheveled Caucasian gentleman, currently in no acute distress.  HEAD: Normocephalic, atraumatic.  EYES: Pupils equal, round, reactive to light. Extraocular muscles intact. No scleral icterus.  MOUTH: Moist mucous membranes.  Dentition intact. No abscess noted.  EARS, NOSE, AND THROAT: Clear without exudates. No external lesions.  NECK: Supple. No thyromegaly. No nodules. No JVD.  PULMONARY: Coarse breath sounds, diminished breath sounds throughout all lung fields with scan wheeze noted. No use of accessory muscles but tachypneic. Good respiratory effort.  CHEST:  Nontender on palpation.  CARDIOVASCULAR: S1, S2, tachycardic. No murmurs, rubs, or gallops. No edema. Pedal pulses 2+ bilaterally.  GASTROINTESTINAL: Soft, nontender, nondistended. No masses. Positive bowel sounds. No hepatosplenomegaly.  MUSCULOSKELETAL: Over the right elbow is currently bent, after joint aspiration, there is some surrounding erythema and edema. The joint is also tender to palpitation. Otherwise no further joint swelling, clubbing, or edema. Range of motion is full in all extremities. NEUROLOGIC: Cranial nerves II through XII intact. No gross neurologic deficits. Sensation intact. Reflexes  intact.  SKIN: No ulcerations, lesions, no   rashes, or cyanosis. Skin warm and dry and turgor intact. PSYCHIATRIC: Mood and affect within normal limits. The patient alert and oriented x 3. Insight and judgment intact.   LABORATORY DATA: Chest x-ray performed revealing no acute cardiopulmonary process. Sodium 136, potassium 3.1, chloride 103, bicarbonate 25, BUN 12, creatinine 0.98, glucose 209. Troponin less than 0.02. WBC 14.3, hemoglobin 12.4, platelets of 401,000. Synovial fluid: Most of the results are pending at this time. However, nucleated cells 37,272, which is 92% neutrophils. No crystals .   URINALYSIS: WBCs 129, RBCs 15, leukocyte esterase 3+, nitrite positive. Epithelial cells 1.   ASSESSMENT AND PLAN: A 74-year-old gentleman presenting with right elbow pain cough.  1. Sepsis: Meeting septic criteria by respiratory rate, heart rate, and leukocytosis. Multiple possible etiology of source including urinary tract infection and septic joint. Panculture including urine, blood, and joint aspirate for Gram stain. Antibiotic coverage will dose ceftriaxone 2 grams as well as the addition of vancomycin, received azithromycin in the emergency department so far. Will provide IV fluid hydration to keep mean arterial pressure greater than 65. Follow culture data. Whenever it returns, we can downgrade antibiotic therapy.  2. Hyperglycemia with random blood glucose greater than 200. Likely, diabetic we will check hemoglobin A1c. Add insulin sliding scale with q. 6 hour Accu-Cheks.  3. Hypokalemia. Replace potassium to goal of 4-5.  4. Acute exacerbation given by cough and hypoxemia and wheezing on exam. DuoNeb therapy as well as supplemental oxygen as required to keep oxygen saturation greater than 92%. 5. Venous thromboembolism prophylaxis with sequential compression devices.   CODE STATUS: The patient is full code.   TIME SPENT: 45 minutes.   ____________________________ David K. Hower,  MD dkh:lt D: 01/27/2014 00:43:12 ET T: 01/27/2014 01:17:37 ET JOB#: [id number]  cc: David K. Hower, MD, <Dictator> DAVID K HOWER MD ELECTRONICALLY SIGNED 01/27/2014 2:50 

## 2015-04-16 DEATH — deceased

## 2015-04-30 IMAGING — CR DG FEMUR 2V*L*
1 series · 4 of 4 positions shown · non-contrast
Comparison: None.

CLINICAL DATA: Fall with left-sided pain.

EXAM:
LEFT FEMUR - 2 VIEW

[Series 1: t femur distal ap left · 0.14mm/px · 4 of 4 slices shown]
[im 1/4]
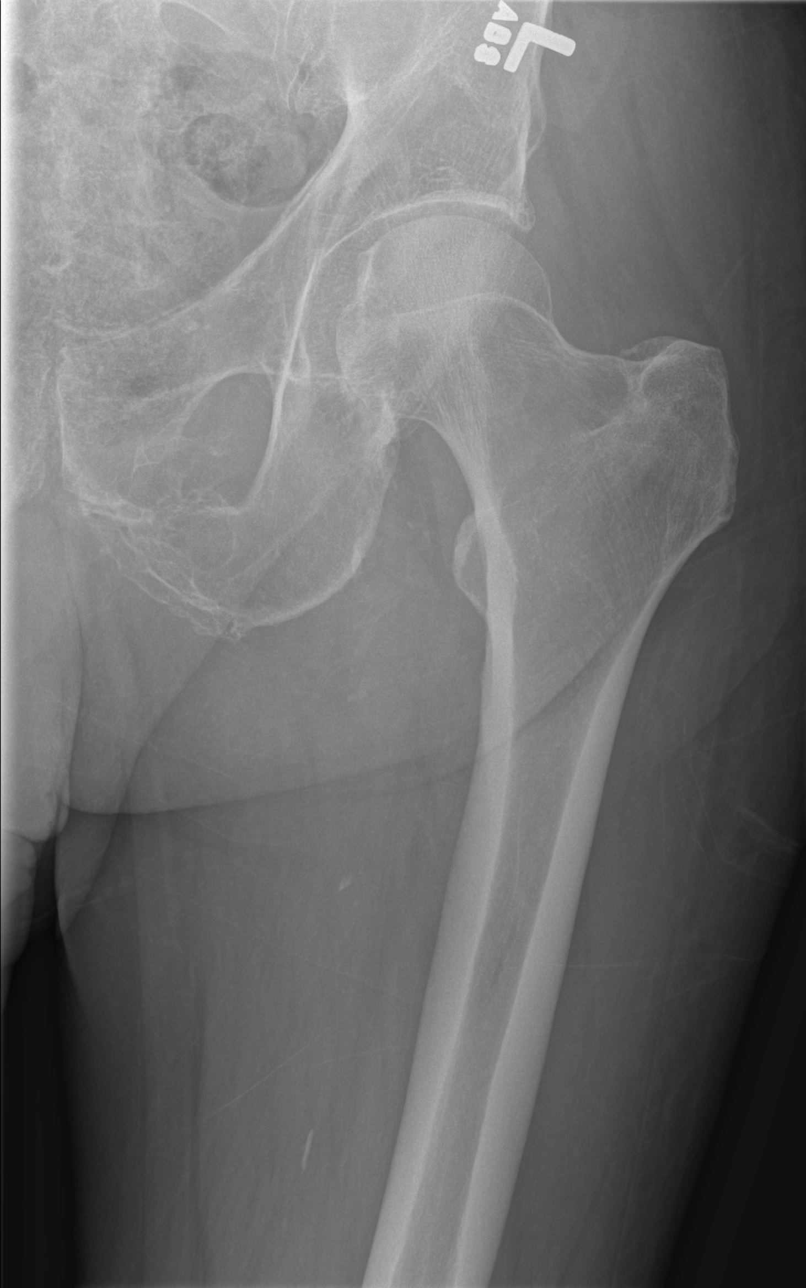
[im 2/4]
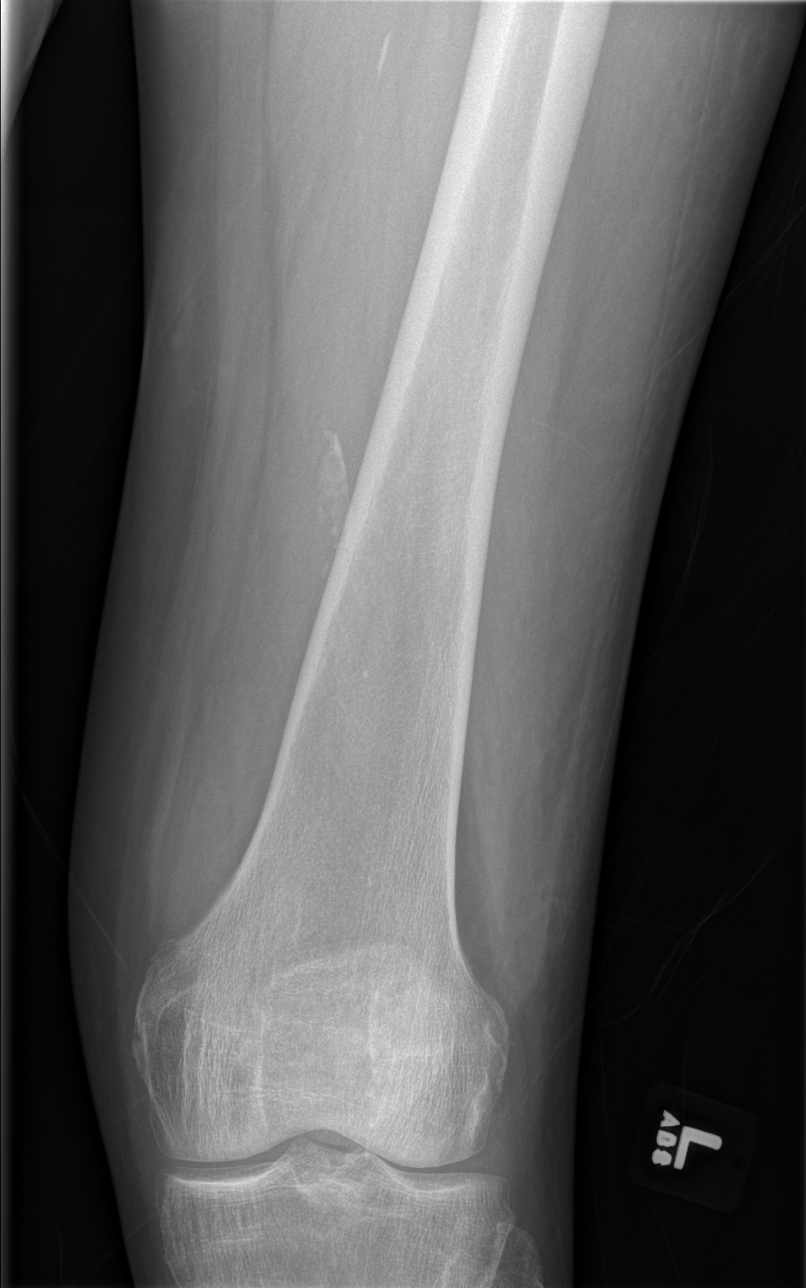
[im 3/4]
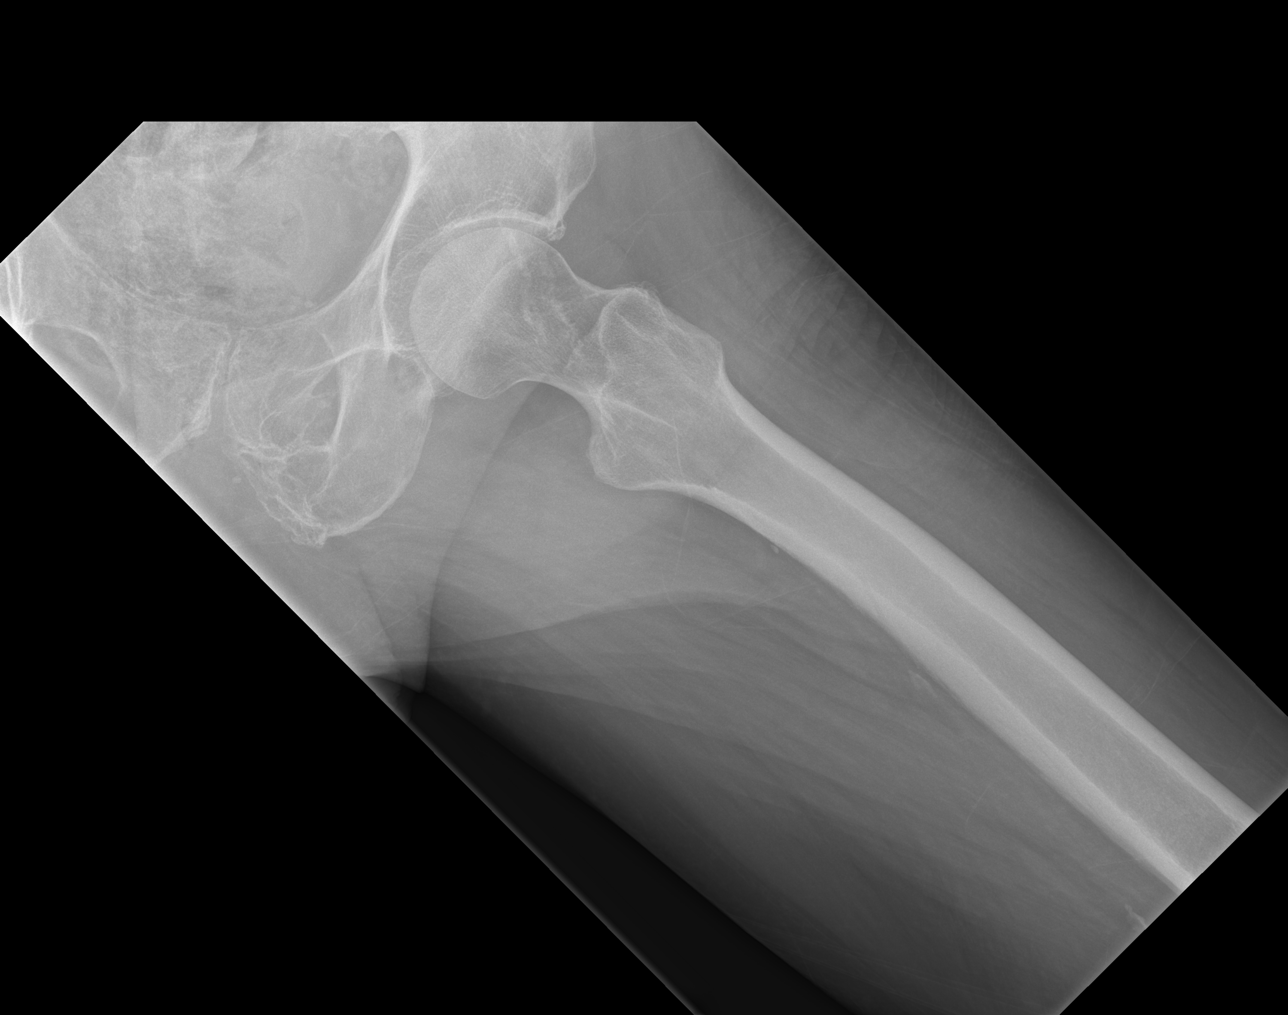
[im 4/4]
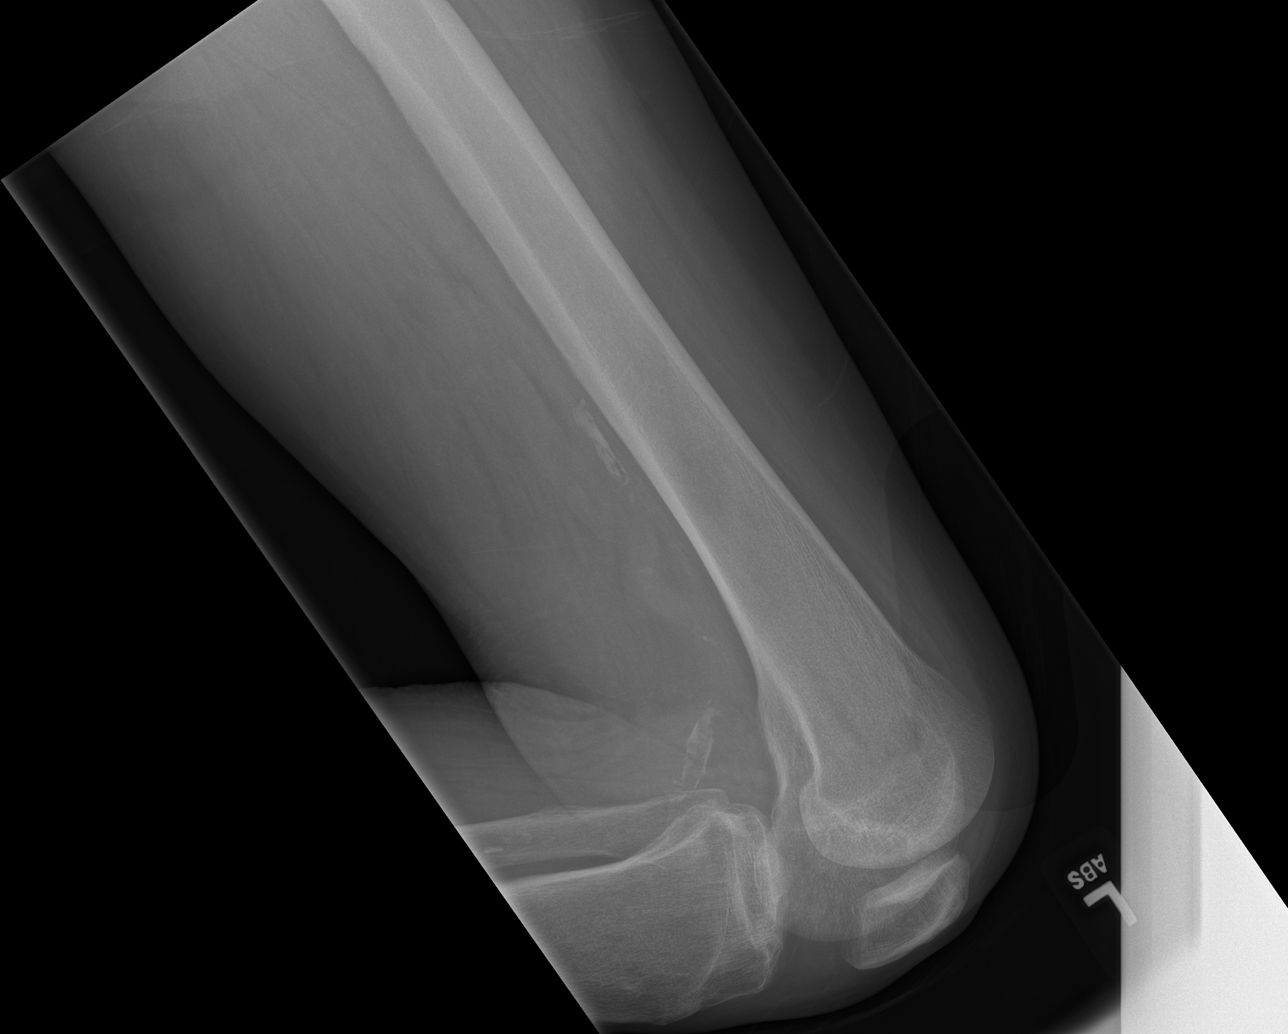

[4 of 4 positions shown; findings below may reference images not displayed]

FINDINGS: Two view exam of the left femur shows no evidence for an acute
fracture. No worrisome lytic or sclerotic osseous abnormality. Bones
are demineralized. Vascular calcification is evident. There is some
minimal degenerative change in the roof of the acetabulum.
IMPRESSION: No acute bony findings.

## 2015-04-30 IMAGING — CR DG CHEST 1V PORT
1 series · 2 of 2 positions shown · non-contrast
Comparison: None.

CLINICAL DATA: Weakness.  Possible fall.  Confusion.

EXAM:
PORTABLE CHEST - 1 VIEW

[Series 1: ap · 0.17mm/px · 2 of 2 slices shown]
[im 1/2]
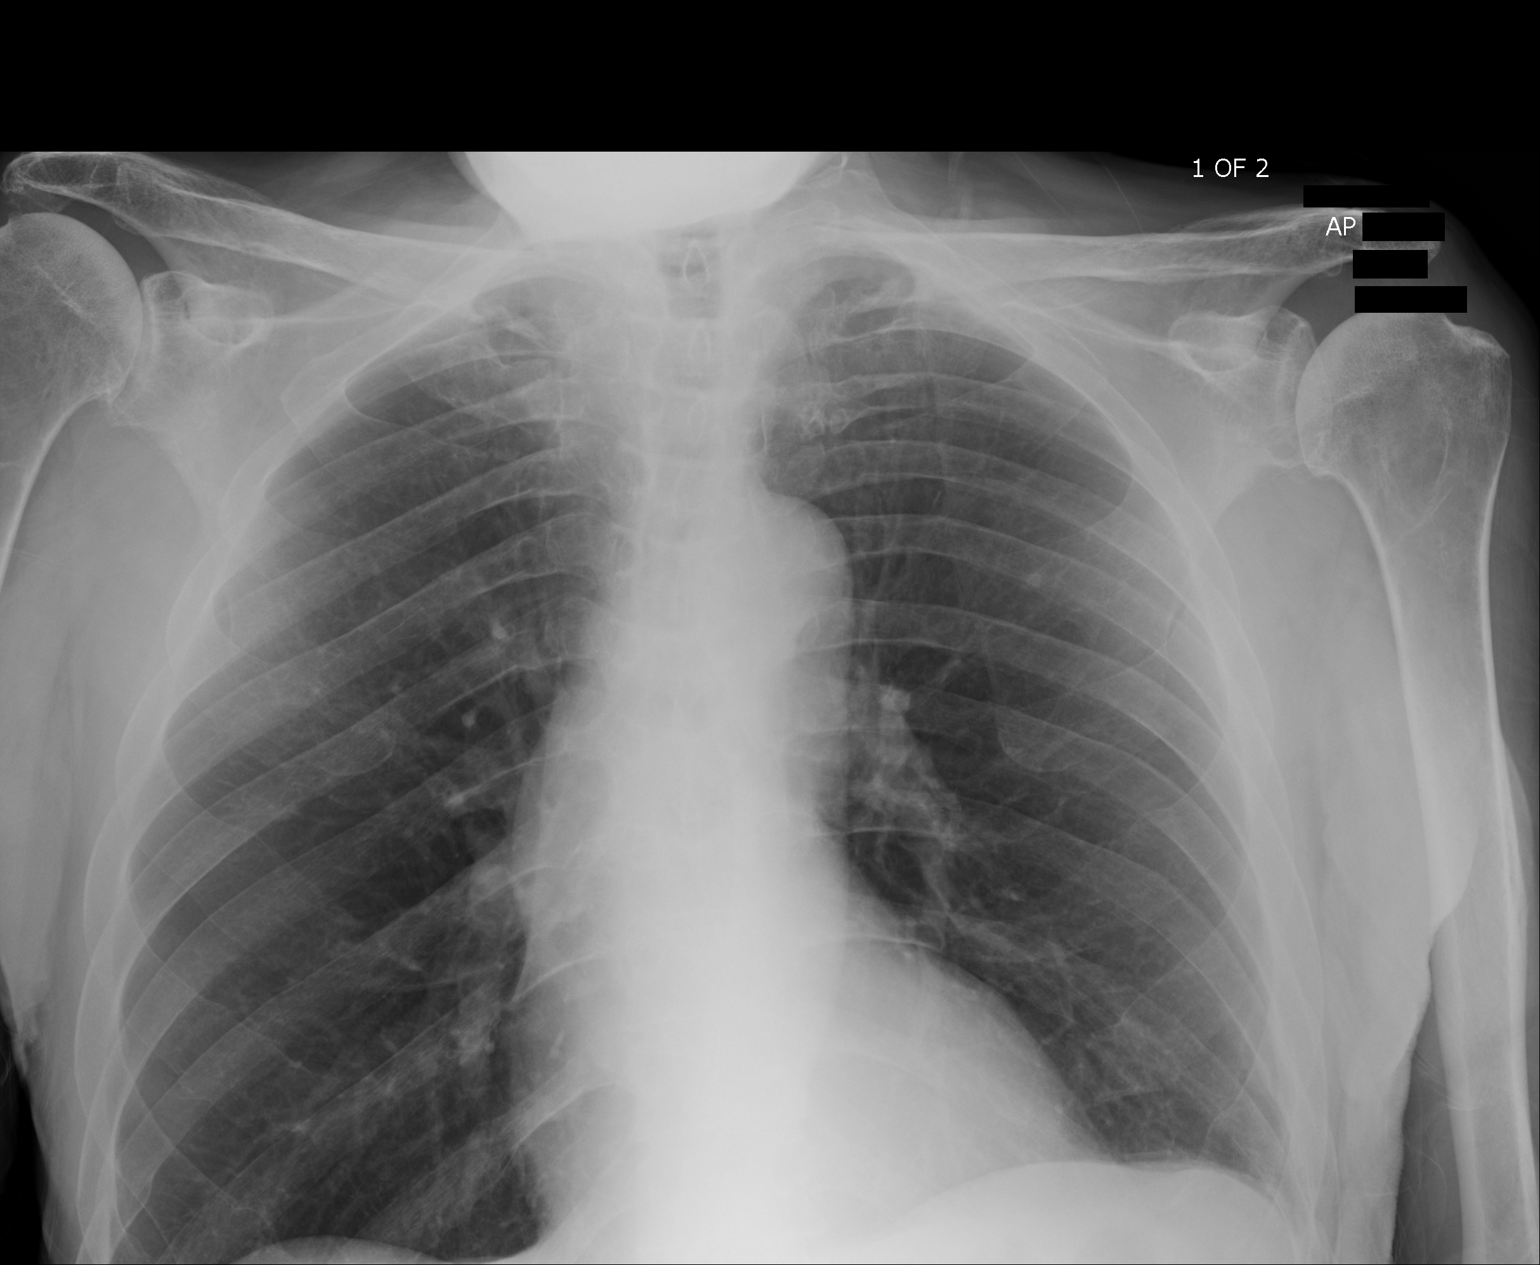
[im 2/2]
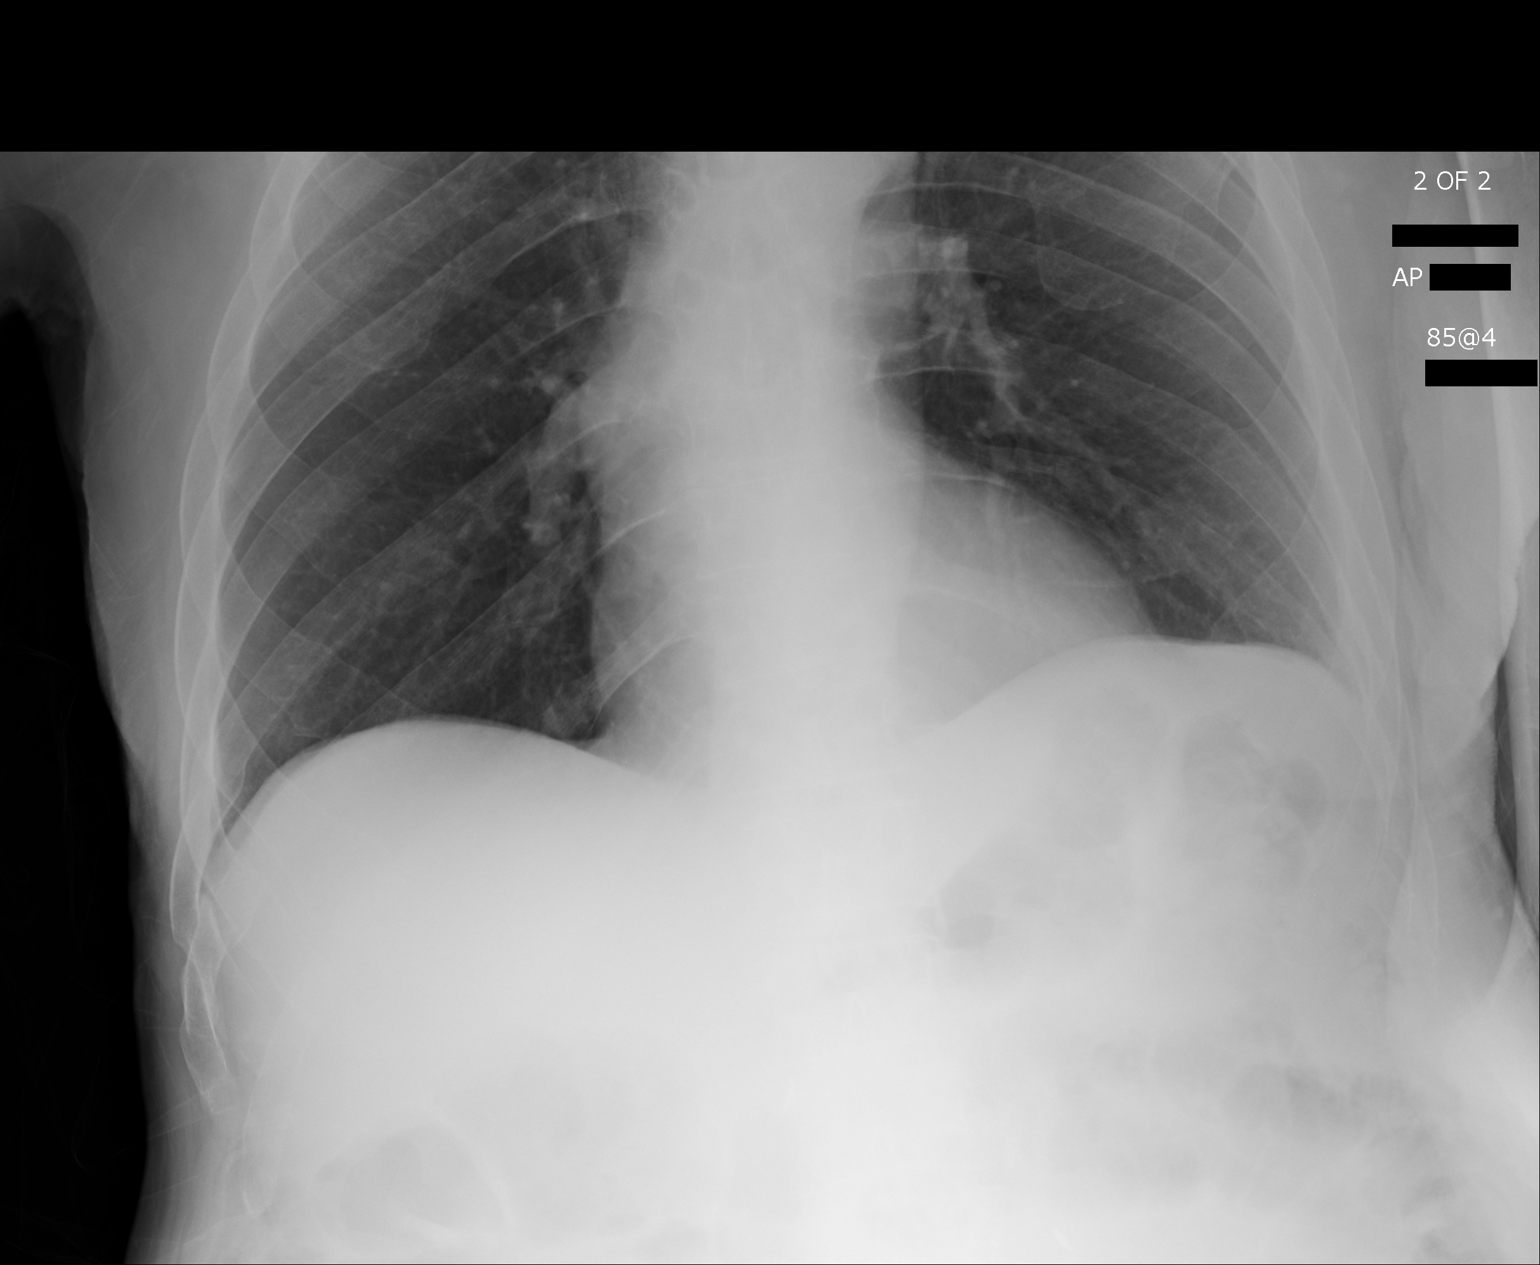

[2 of 2 positions shown; findings below may reference images not displayed]

FINDINGS: Mild hyperinflation of the lungs. Heart is normal size. Lungs are
clear. No effusions. No acute bony abnormality. Degenerative changes
in the shoulders.
IMPRESSION: Mild hyperinflation, likely COPD.

No acute findings.

## 2015-04-30 IMAGING — CR DG LUMBAR SPINE 2-3V
1 series · 3 of 3 positions shown · non-contrast
Comparison: DG CHEST 1V PORT dated 12/15/2013

CLINICAL DATA: Back pain.  Possible fall 3 or 4 days ago.

EXAM:
LUMBAR SPINE - 2-3 VIEW

[Series 1: t lumbar spine ap · 0.14mm/px · 3 of 3 slices shown]
[im 1/3]
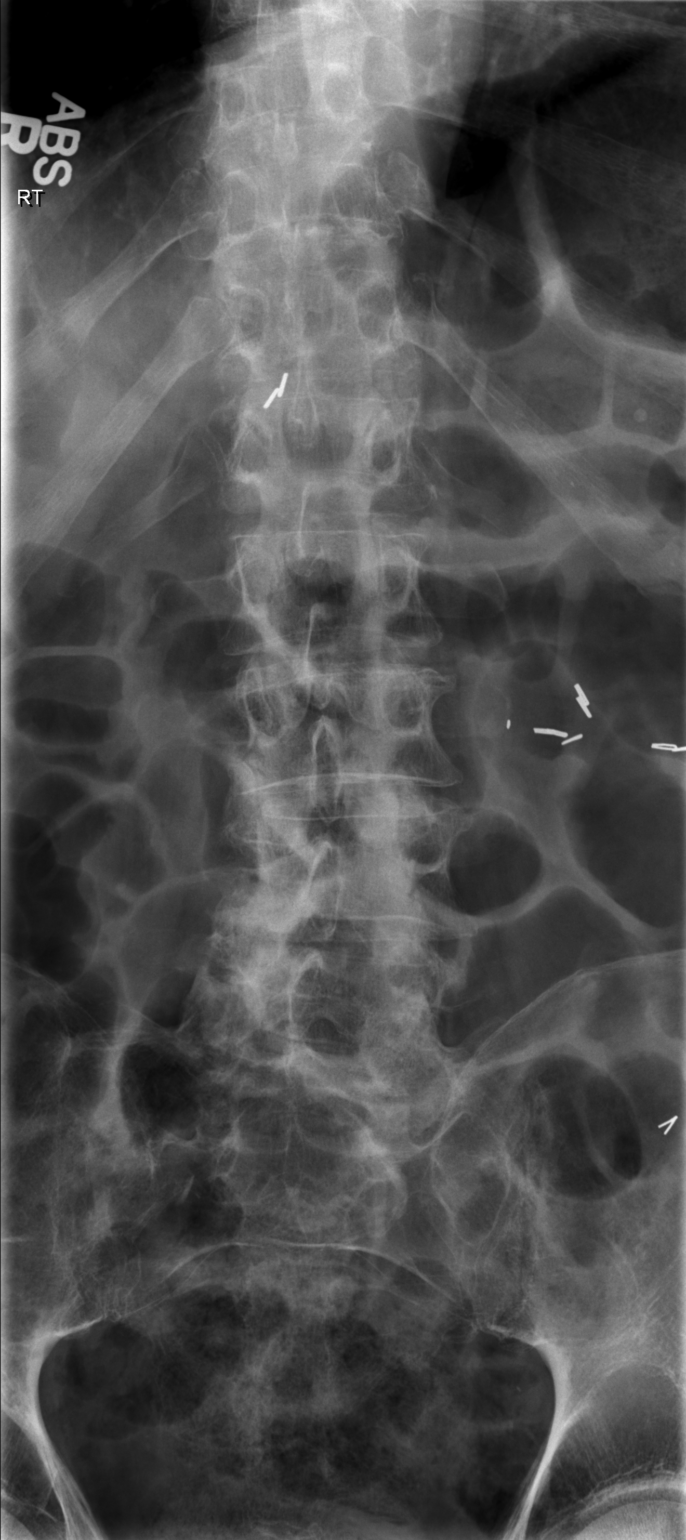
[im 2/3]
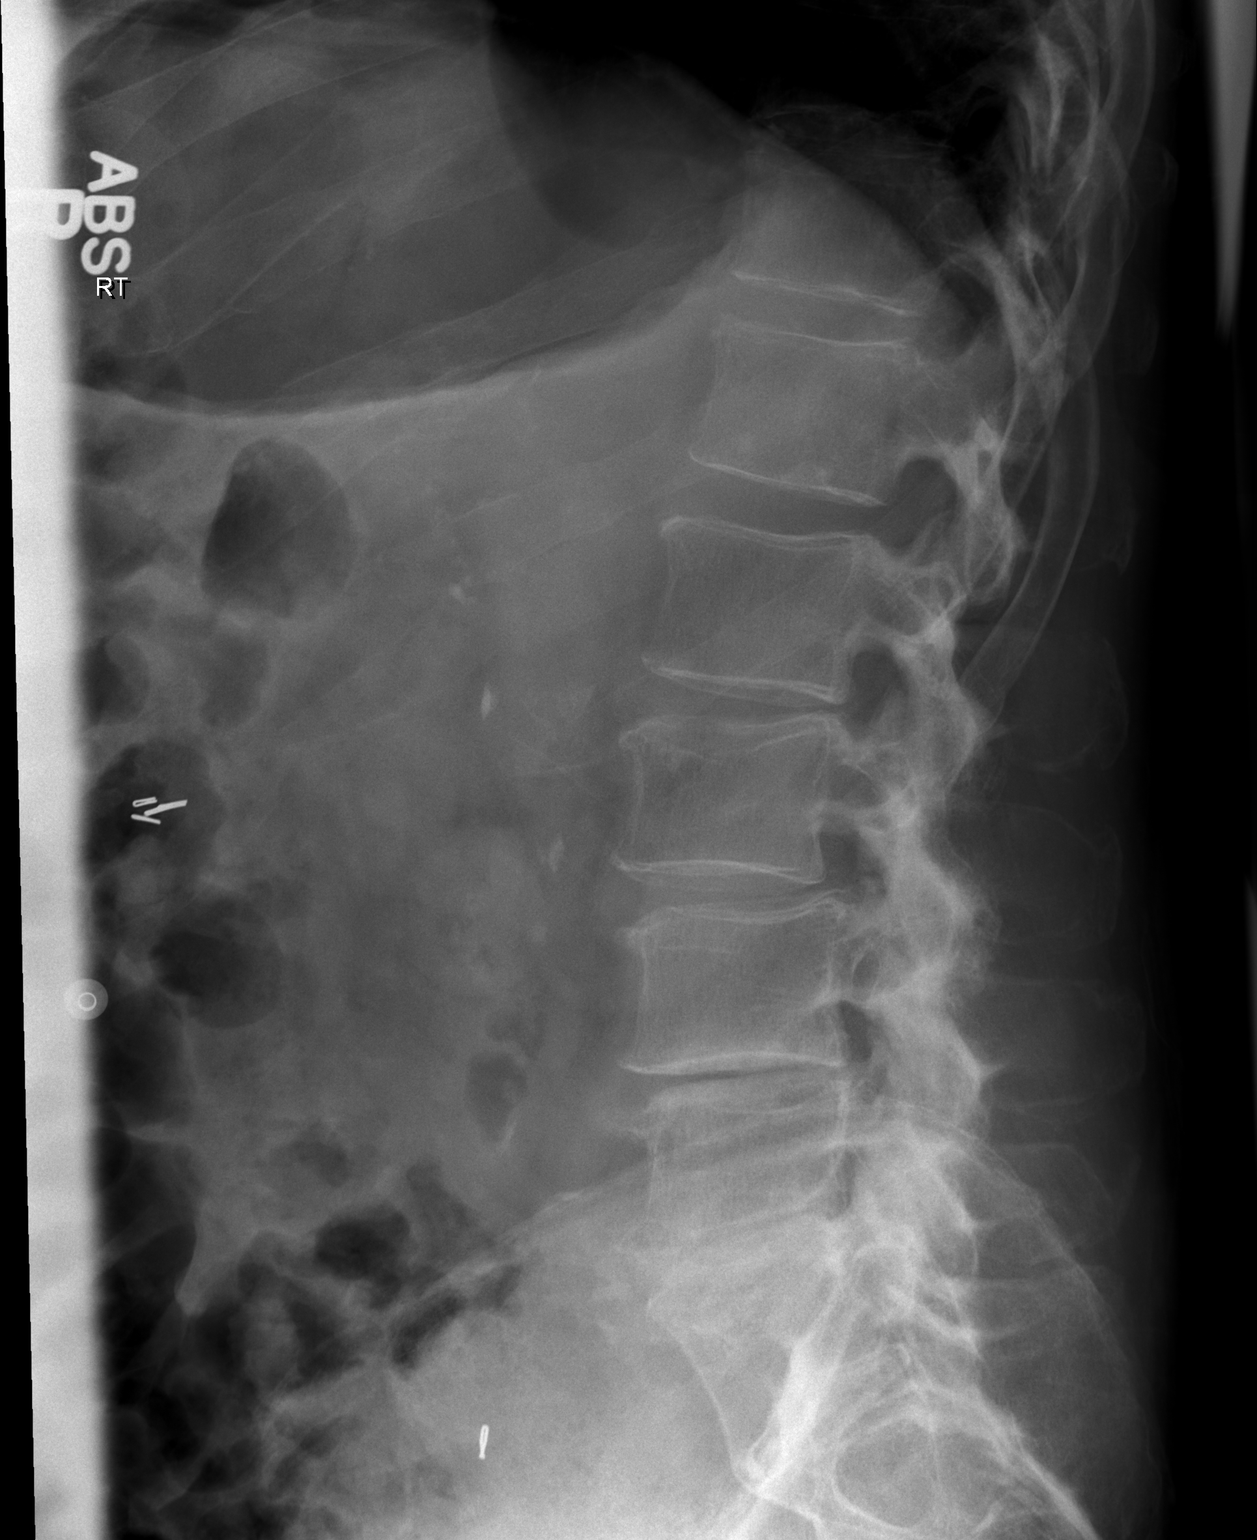
[im 3/3]
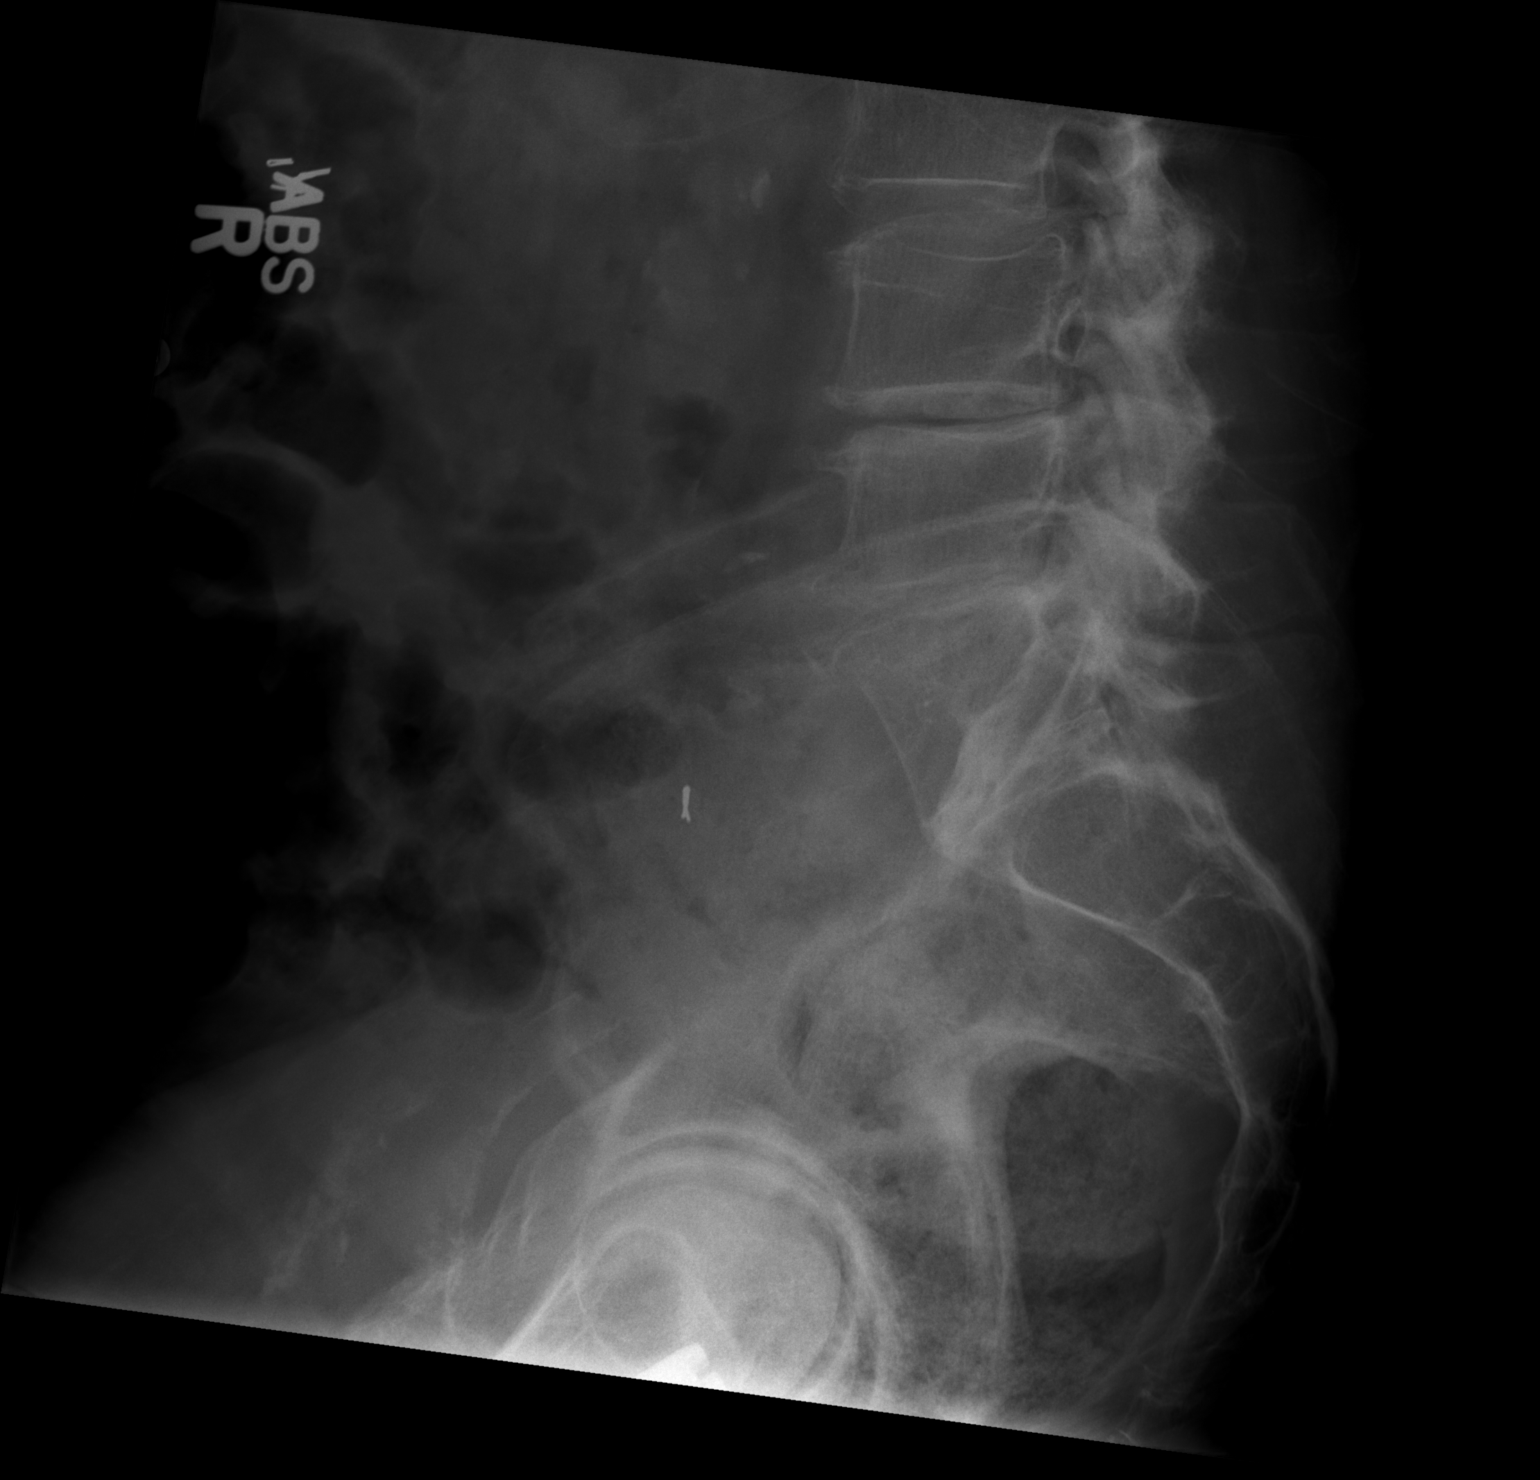

[3 of 3 positions shown; findings below may reference images not displayed]

FINDINGS: There is transitional lumbosacral anatomy. Based on prior chest
radiographs demonstrating 12 rib-bearing thoracic type vertebral
bodies, there appear to be small ribs at L1 and a transitional
partially lumbarized S1 segment. There is a mild convex left
scoliosis. At L3-4, there is 3 mm of anterolisthesis. There is disc
space loss throughout the lumbar spine with associated facet
disease. No acute fracture or pars defect is evident.
IMPRESSION: Transitional lumbosacral anatomy with multilevel spondylosis. No
acute osseous findings identified.

## 2015-04-30 IMAGING — CR DG HAND COMPLETE 3+V*L*
1 series · 3 of 3 positions shown · non-contrast
Comparison: None.

CLINICAL DATA: Left hand swelling, redness.

EXAM:
LEFT HAND - COMPLETE 3+ VIEW

[Series 1: pa · 0.17mm/px · 3 of 3 slices shown]
[im 1/3]
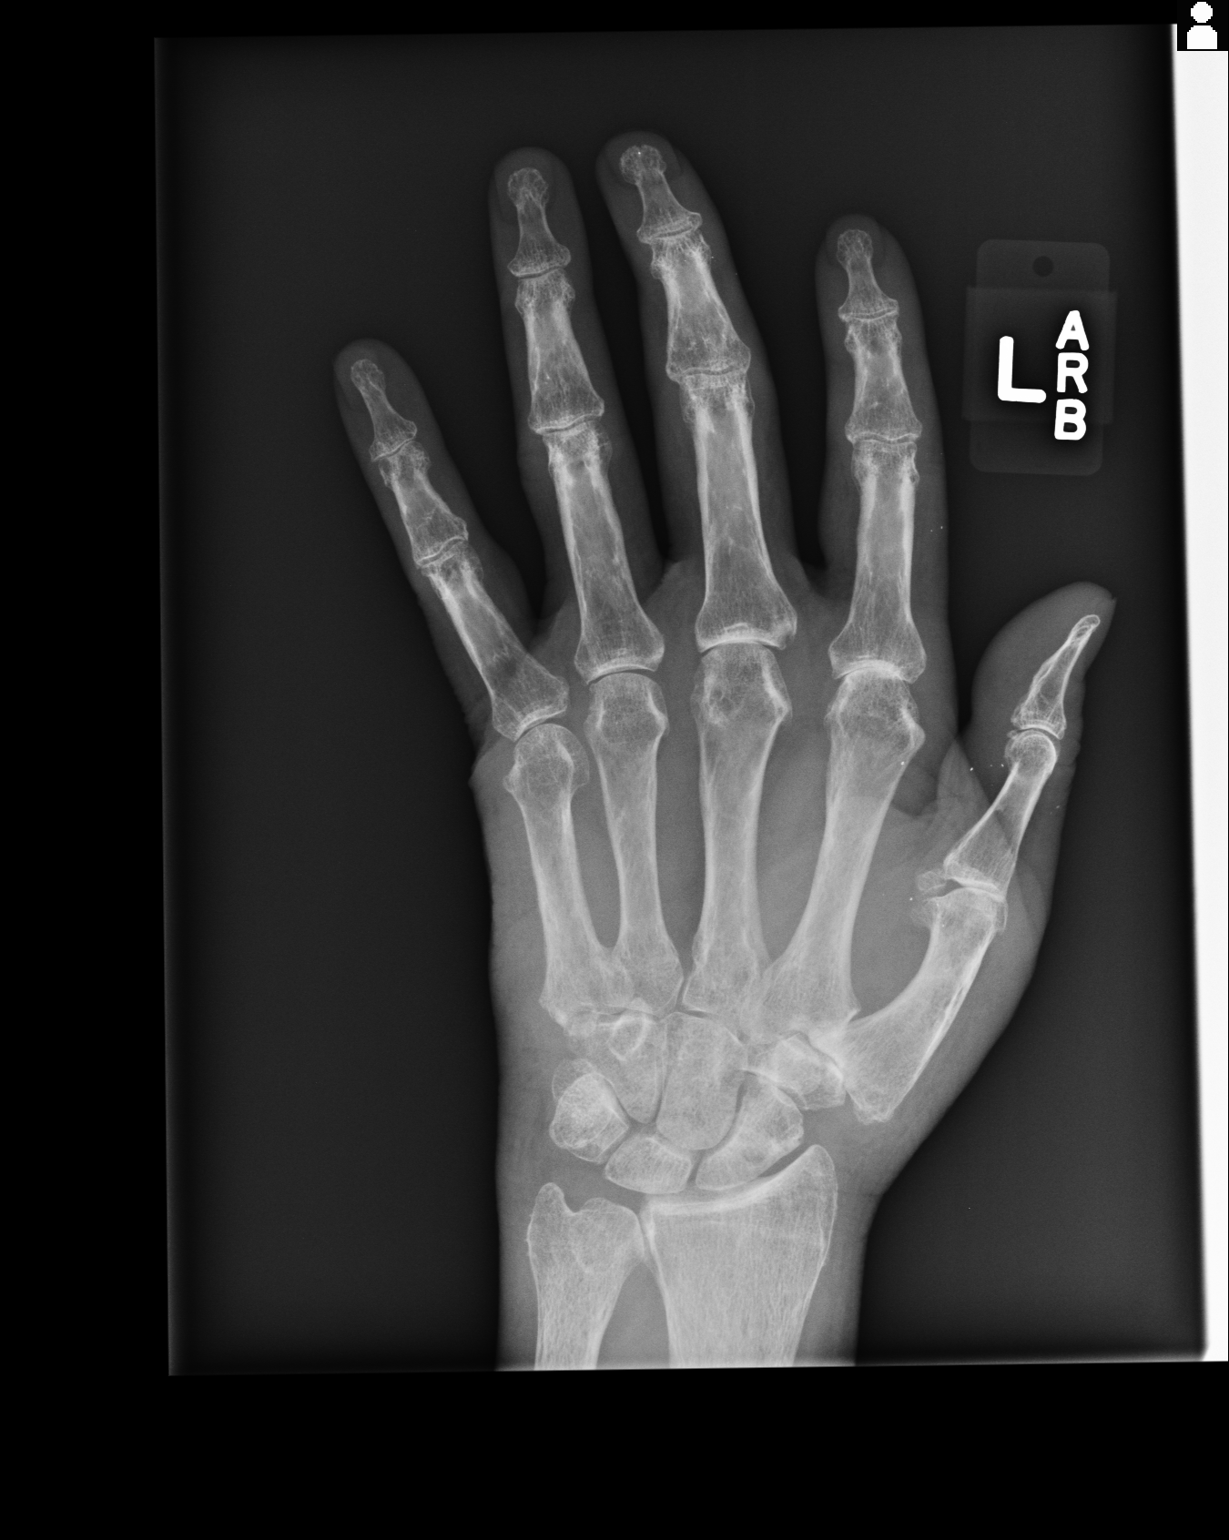
[im 2/3]
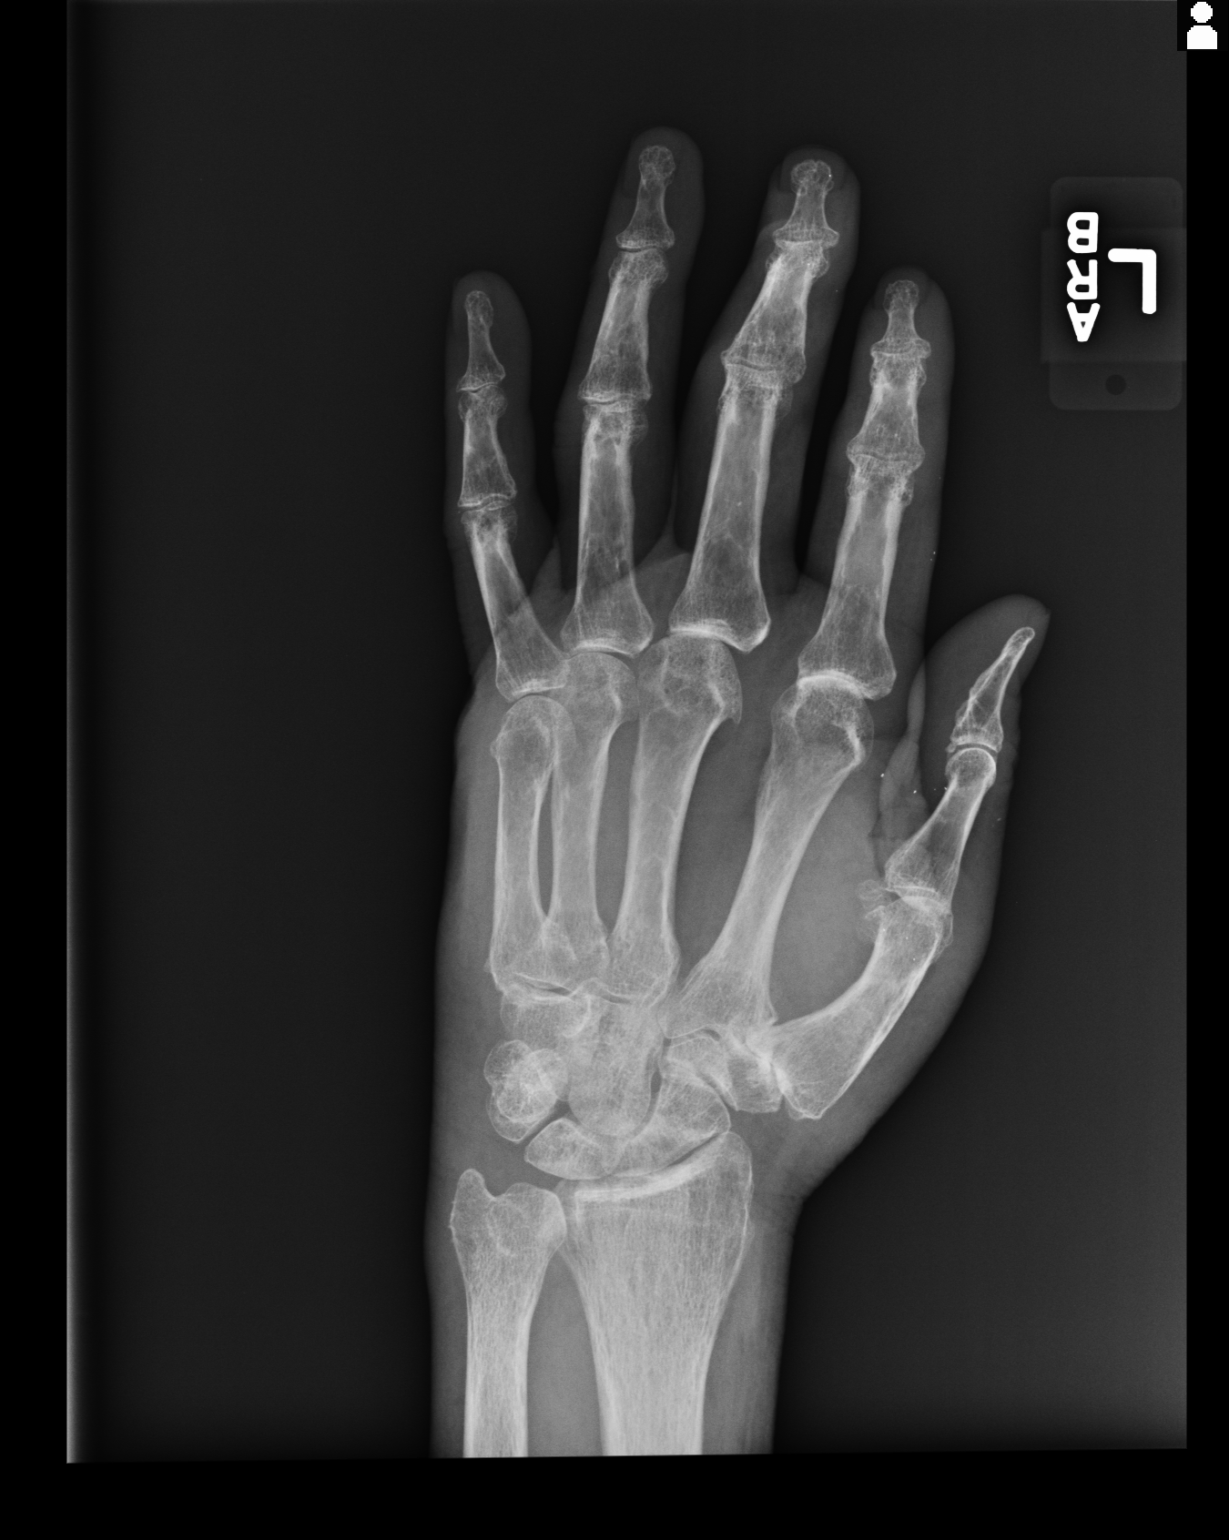
[im 3/3]
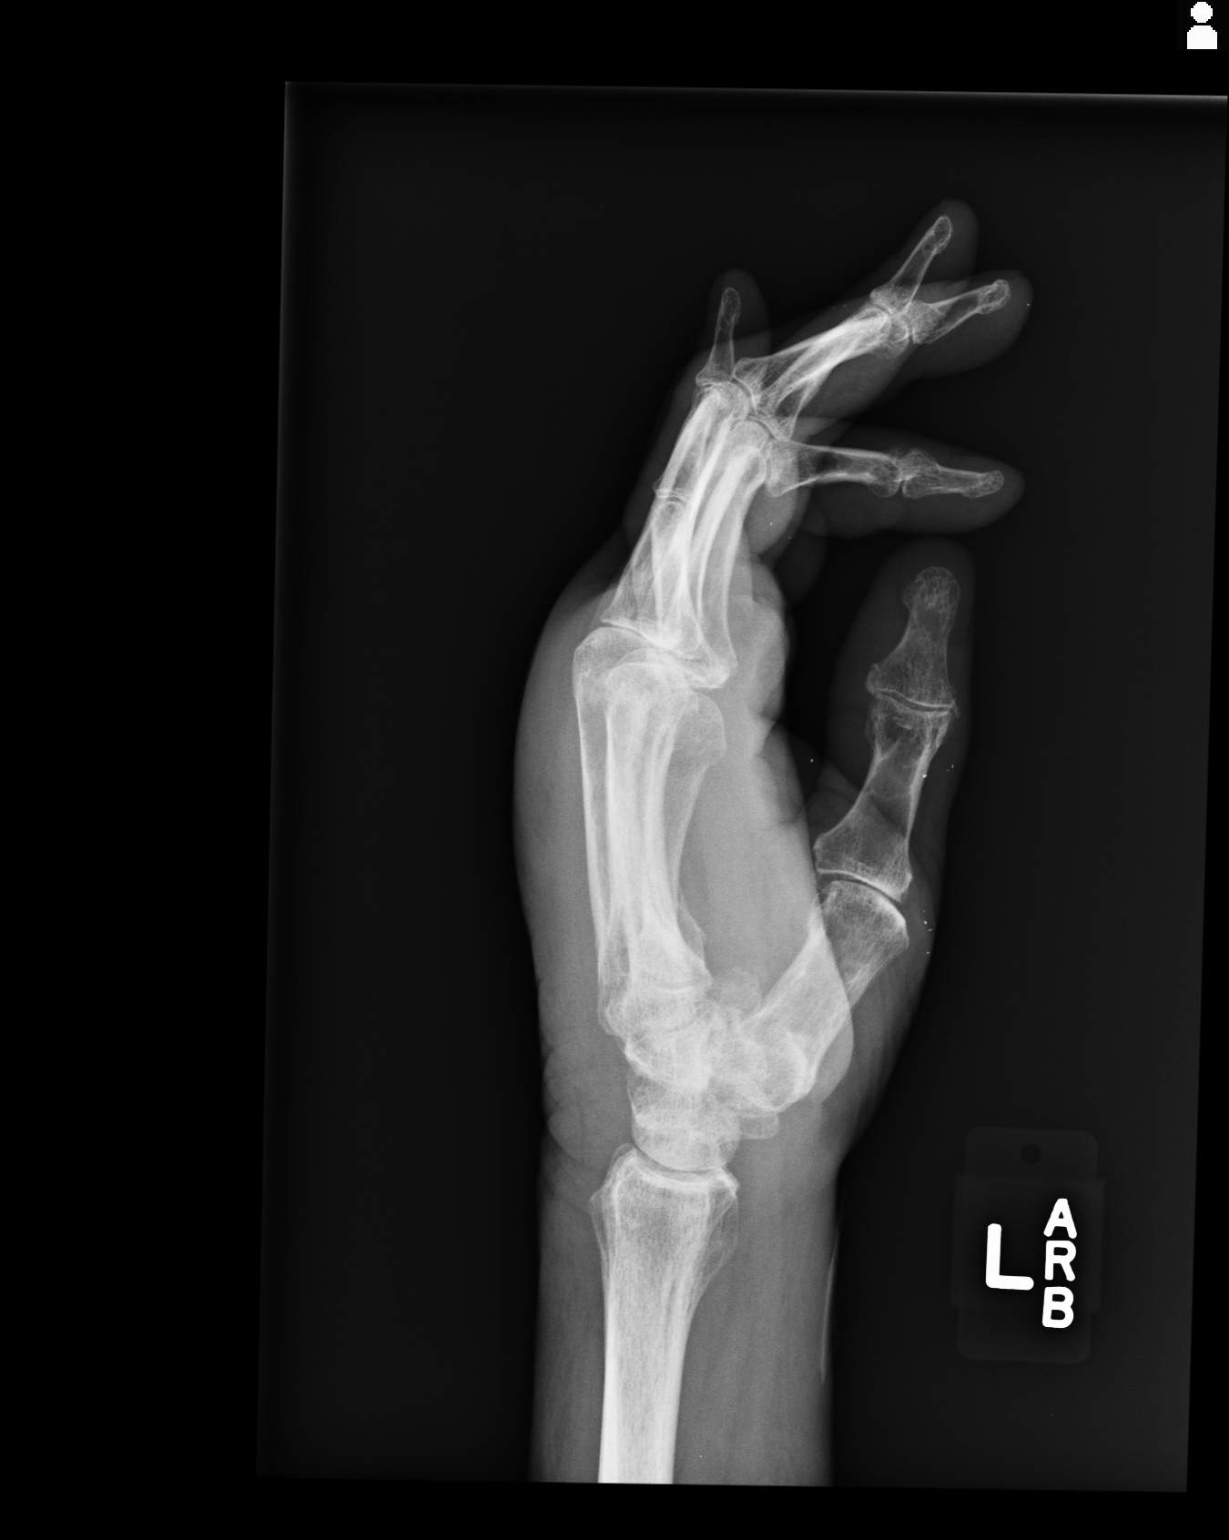

[3 of 3 positions shown; findings below may reference images not displayed]

FINDINGS: Diffuse moderate to advanced it osteoarthritis throughout the DIP,
PIP, MCP and first carpometacarpal joints. Diffuse soft tissue
swelling. No fracture, subluxation or dislocation.
IMPRESSION: No acute bony abnormality.

## 2015-05-17 DEATH — deceased
